# Patient Record
Sex: Female | Born: 1950
Health system: Southern US, Community
[De-identification: ages and names within clinical notes are randomized; demographics above are authoritative.]

## PROBLEM LIST (undated history)

## (undated) DIAGNOSIS — E119 Type 2 diabetes mellitus without complications: Secondary | ICD-10-CM

## (undated) DIAGNOSIS — I10 Essential (primary) hypertension: Secondary | ICD-10-CM

## (undated) DIAGNOSIS — I2510 Atherosclerotic heart disease of native coronary artery without angina pectoris: Secondary | ICD-10-CM

## (undated) DIAGNOSIS — K5792 Diverticulitis of intestine, part unspecified, without perforation or abscess without bleeding: Secondary | ICD-10-CM

## (undated) HISTORY — PX: CORONARY ANGIOPLASTY WITH STENT PLACEMENT: SHX49

---

## 2001-06-24 ENCOUNTER — Inpatient Hospital Stay (HOSPITAL_COMMUNITY): Admission: AD | Admit: 2001-06-24 | Discharge: 2001-06-29 | Payer: Self-pay | Admitting: Cardiology

## 2003-10-05 ENCOUNTER — Emergency Department (HOSPITAL_COMMUNITY): Admission: EM | Admit: 2003-10-05 | Discharge: 2003-10-06 | Payer: Self-pay | Admitting: Emergency Medicine

## 2004-08-08 ENCOUNTER — Emergency Department (HOSPITAL_COMMUNITY): Admission: EM | Admit: 2004-08-08 | Discharge: 2004-08-09 | Payer: Self-pay | Admitting: Emergency Medicine

## 2005-08-25 IMAGING — CR DG CHEST 1V PORT
1 series · 1 of 1 positions shown · non-contrast
Comparison: none

CLINICAL DATA: Arm pain and numbness.  History of heart disease. 
 CHEST PORTABLE, ONE VIEW 10/06/03 AT 6665 HOURS
 The heart and mediastinum are unremarkable.  The lungs are clear.  Vascularity is normal.  
 IMPRESSION
 No active disease.

[view not recorded]
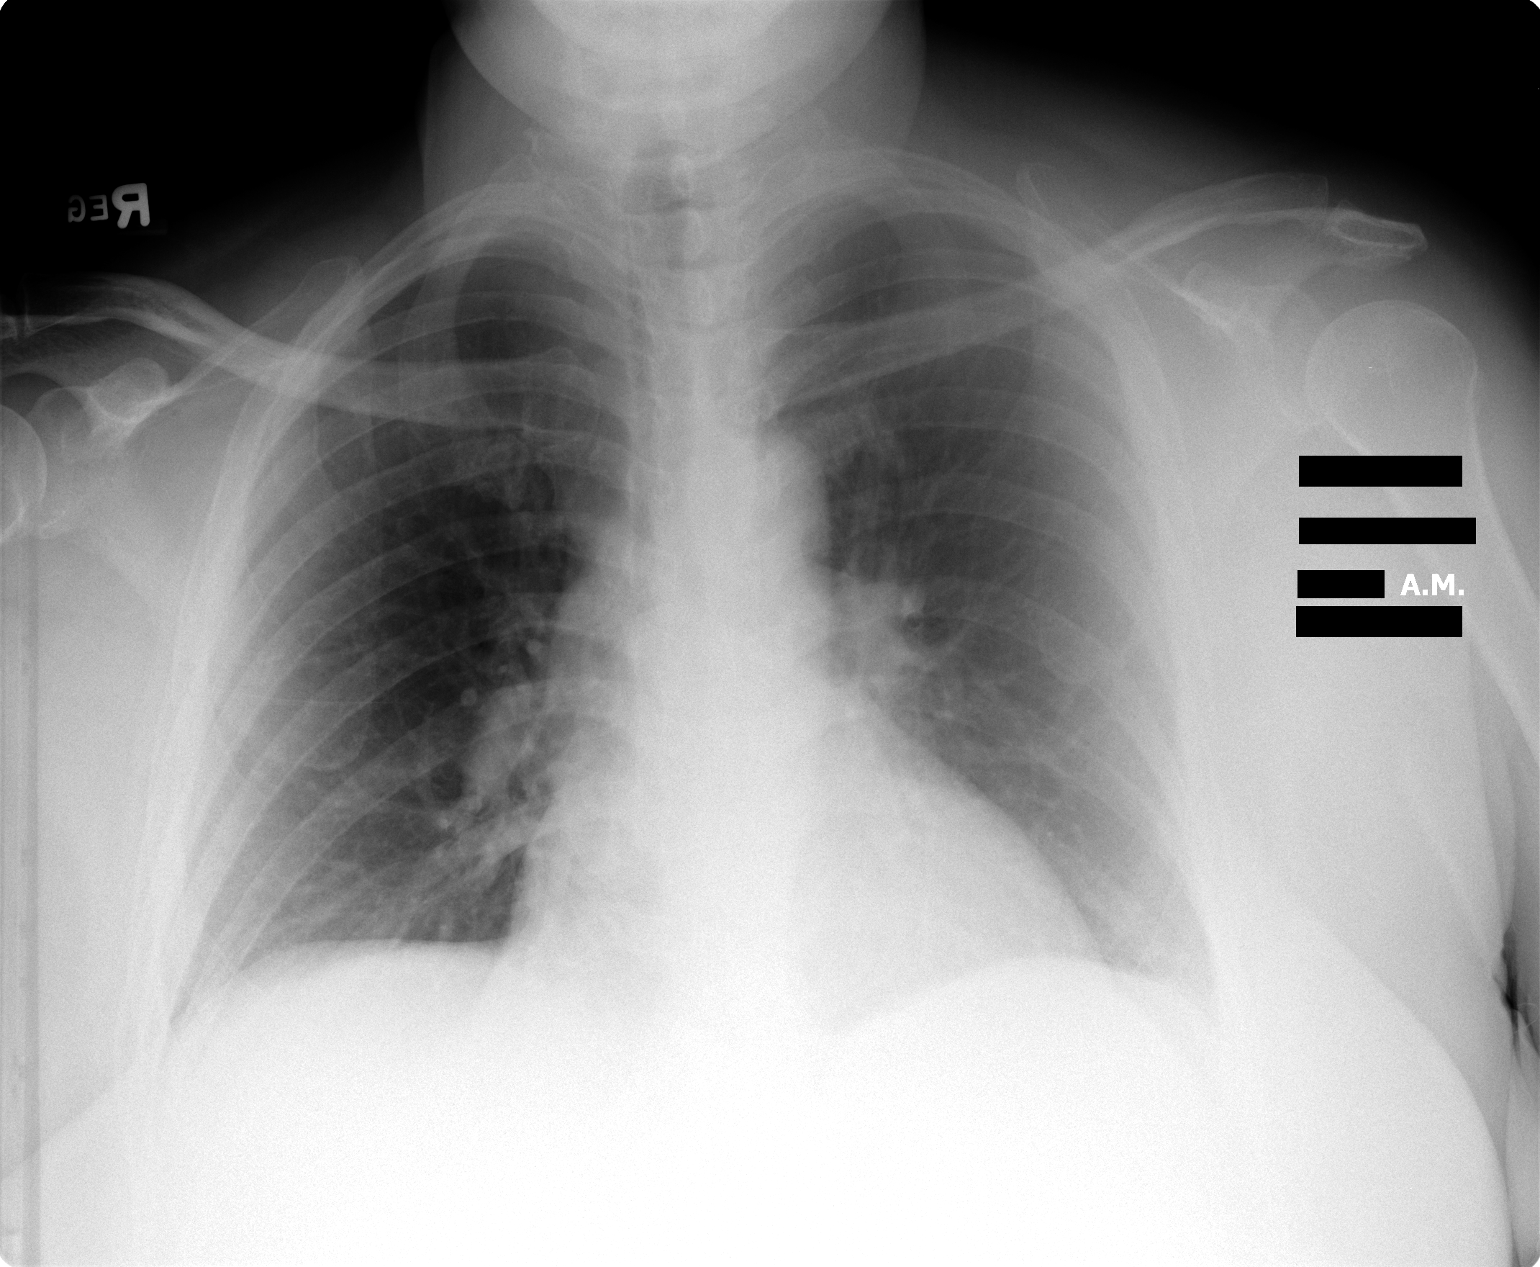

[1 of 1 positions shown; findings below may reference images not displayed]

## 2008-10-05 ENCOUNTER — Inpatient Hospital Stay (HOSPITAL_COMMUNITY): Admission: AD | Admit: 2008-10-05 | Discharge: 2008-10-06 | Payer: Self-pay | Admitting: Cardiology

## 2008-10-05 ENCOUNTER — Ambulatory Visit: Payer: Self-pay | Admitting: Cardiology

## 2008-10-17 DIAGNOSIS — K5730 Diverticulosis of large intestine without perforation or abscess without bleeding: Secondary | ICD-10-CM | POA: Insufficient documentation

## 2008-10-17 DIAGNOSIS — E119 Type 2 diabetes mellitus without complications: Secondary | ICD-10-CM | POA: Insufficient documentation

## 2008-10-17 DIAGNOSIS — E669 Obesity, unspecified: Secondary | ICD-10-CM

## 2008-10-17 DIAGNOSIS — E785 Hyperlipidemia, unspecified: Secondary | ICD-10-CM

## 2008-10-17 DIAGNOSIS — I2510 Atherosclerotic heart disease of native coronary artery without angina pectoris: Secondary | ICD-10-CM | POA: Insufficient documentation

## 2008-10-17 DIAGNOSIS — R079 Chest pain, unspecified: Secondary | ICD-10-CM

## 2008-10-17 DIAGNOSIS — I10 Essential (primary) hypertension: Secondary | ICD-10-CM | POA: Insufficient documentation

## 2008-11-20 ENCOUNTER — Encounter (INDEPENDENT_AMBULATORY_CARE_PROVIDER_SITE_OTHER): Payer: Self-pay | Admitting: *Deleted

## 2008-12-26 ENCOUNTER — Ambulatory Visit: Payer: Self-pay | Admitting: Cardiovascular Disease

## 2008-12-26 DIAGNOSIS — I2510 Atherosclerotic heart disease of native coronary artery without angina pectoris: Secondary | ICD-10-CM

## 2008-12-26 DIAGNOSIS — I10 Essential (primary) hypertension: Secondary | ICD-10-CM

## 2008-12-29 ENCOUNTER — Encounter: Payer: Self-pay | Admitting: Cardiovascular Disease

## 2008-12-29 ENCOUNTER — Telehealth: Payer: Self-pay | Admitting: Cardiovascular Disease

## 2009-01-05 ENCOUNTER — Telehealth (INDEPENDENT_AMBULATORY_CARE_PROVIDER_SITE_OTHER): Payer: Self-pay | Admitting: *Deleted

## 2010-06-16 LAB — GLUCOSE, CAPILLARY
Glucose-Capillary: 165 mg/dL — ABNORMAL HIGH (ref 70–99)
Glucose-Capillary: 177 mg/dL — ABNORMAL HIGH (ref 70–99)
Glucose-Capillary: 203 mg/dL — ABNORMAL HIGH (ref 70–99)

## 2010-06-16 LAB — BASIC METABOLIC PANEL
CO2: 26 mEq/L (ref 19–32)
Calcium: 8.8 mg/dL (ref 8.4–10.5)
Chloride: 103 mEq/L (ref 96–112)
Creatinine, Ser: 0.58 mg/dL (ref 0.4–1.2)
Glucose, Bld: 252 mg/dL — ABNORMAL HIGH (ref 70–99)
Potassium: 3.3 mEq/L — ABNORMAL LOW (ref 3.5–5.1)

## 2010-06-16 LAB — HEMOGLOBIN A1C: Mean Plasma Glucose: 197 mg/dL

## 2010-07-23 NOTE — H&P (Signed)
[name], [name] NO.:  [id number]   MEDICAL RECORD NO.:  [id number]          PATIENT TYPE:  INP   LOCATION:  3710                         FACILITY:  [facility]   PHYSICIAN:  Tina Morton. Riley Kill, MD, FACCDATE OF BIRTH:  1950/05/03   DATE OF ADMISSION:  10/05/2008  DATE OF DISCHARGE:                              HISTORY & PHYSICAL   PRIMARY CARE PHYSICIAN:  Dr. Caffie Damme at the [facility] in  [place], [place].   PRIMARY CARDIOLOGIST:  None.   CHIEF COMPLAINT:  Chest pain.   HISTORY OF PRESENT ILLNESS:  Tina Bennett is a 60 year old female with a  history of coronary artery disease.  Last night, she had some right-  sided neck pain and went to bed.  She woke up at 1:00 a.m. with worsened  right-sided neck pain.  She now describes as a pressure and some chest  pain that was a substernal chest pressure as well.  She also felt that  her heart was racing.  She went to the emergency room at 2:00 a.m. where  she was treated with nitroglycerin, beta-blocker, and her symptoms  improved.  Her enzymes were negative for MI, but she had Lexiscan that  was abnormal by report.  As part of her evaluation.  She had a CT  angiogram of the chest which was negative for PE, and C-spine x-rays  that showed no acute disease.  She had some diarrhea and nausea, so an  abdominal ultrasound was also performed which was negative.  With the  abnormal Lexiscan, she was sent to [facility] for cath.  Currently, she is resting comfortably.   PAST MEDICAL HISTORY:  1. Status post cardiac catheterization in 2003 after an abnormal      stress test.  Left main okay, LAD 80% to 0 with an Express stent,      circumflex and RCA okay, EF 65-70% with 1 to 2+ MR.  2. Diabetes.  3. Hypertension.  4. Dyslipidemia with an elevated LDL.  5. Obesity.  6. History of diverticulitis.  7. Family history of coronary artery disease.   SURGICAL HISTORY:  She is status post cardiac  catheterization and  hysterectomy.   ALLERGIES:  No known drug allergies.   MEDICATIONS PRIOR TO ADMISSION:  1. Metformin 500 mg b.i.d.  2. Amaryl 4 mg b.i.d.  3. Accupril 20 mg a day.  4. Aspirin 81 mg a day.  5. Lopressor 25 mg b.i.d.   SOCIAL HISTORY:  She lives in [place] and her son lives with her.  She  works as a [employer] Services operational officer.  She denies any history of alcohol, tobacco, or  drug abuse.  She does not exercise.  She does not stick tightly to a  diabetic diet and admits to dietary indiscretions, especially with the  bread.   FAMILY HISTORY:  Her mother died at age 75 with a PE but no heart  disease, but her father died at age 58 of Alzheimer's having a history  of coronary artery disease.  No siblings have heart disease.   REVIEW OF SYSTEMS:  She feels anxious at times.  She has some chronic  dyspnea on exertion that has not changed recently.  She has occasional  abdominal pain.  She has chronic arthralgias and musculoskeletal pain.  Full 14-point review of systems is, otherwise, negative.   PHYSICAL EXAMINATION:  VITAL SIGNS:  Temperature is 97.6, blood pressure  164/80, pulse 56, respiratory rate 18, O2 saturation 100% on room air.  GENERAL:  She is a well-developed, obese, African American female in no  acute distress.  HEENT:  normal.  NECK:  There is no lymphadenopathy, thyromegaly, bruit, or JVD noted.  CV:  Heart is regular rate and rhythm with an S1 and S2 and no  significant murmur, rub, or gallop is noted.  Distal pulses are intact  in all 4 extremities.  LUNGS:  Clear to auscultation bilaterally.  SKIN:  No rashes or lesions are noted.  ABDOMEN:  Soft and she has some lower abdominal tenderness but no  guarding.  She has active bowel sounds.  EXTREMITIES:  There is no cyanosis, clubbing, or edema noted.  MUSCULOSKELETAL:  There is no joint deformity or effusions and no spine  or CVA tenderness.  NEURO:  She is alert and oriented.  Cranial nerves II through  XII  grossly intact.   Chest x-ray shows heart size in upper limits of normal with pulmonary  vascular congestion.  CT angiogram of the chest shows no PE and minimal  bibasilar atelectasis but no fluid.   C-spine x-rays, no acute disease.   Abdominal ultrasound, no abnormality.   EKG, sinus rhythm, rate 88 with no acute ischemic changes.   LABORATORY VALUES:  Hemoglobin 12.8, hematocrit 38.3, WBC 5.9, platelets  242.  Sodium 138, potassium 3.8, chloride 103, CO2 of 29, BUN 11,  creatinine 0.72, glucose 269.  D-dimer negative.  INR 0.9, PTT 27.6.  Total cholesterol 169, triglycerides 83, HDL 37.2, LDL 115.  ABGs  showing a pH of 7.43, PCO2 of 43, PO2 of 59, bicarb 28.5 on room air.   IMPRESSION:  Tina Bennett was seen today by Dr. Riley Bennett.  She had diarrhea  and neck discomfort then chest pain.  She had an abnormal nuclear scan  with a history of percutaneous intervention to the left anterior  descending with a non-drug-eluting stent in 2003.  She needs re-study.  The risks and benefits of the cardiac catheterization were reviewed with  the patient and her son and they agreed to proceed.  Of note, she said  her sister had problems with a statin and lost a kidney.  The patient  is reluctant to take a statin but was advised we would use a low-dose  statin  and follow her labs closely.  We will hold the metformin and add sliding  scale insulin for her diabetes as well as checking a hemoglobin A1c.  We  will hydrate her overnight and recheck a BMET in the morning as well as  that she had a CT angiogram today.      Tina Demark, PA-C      Tina Morton. Riley Kill, MD, [facility]  Electronically Signed    RB/MEDQ  D:  10/05/2008  T:  10/06/2008  Job:  [id number]

## 2010-07-23 NOTE — Discharge Summary (Signed)
NAME:  Tina Bennett, RAILSBACK NO.:  [id number]   MEDICAL RECORD NO.:  [id number]          PATIENT TYPE:  INP   LOCATION:  3710                         FACILITY:  [facility]   PHYSICIAN:  Verne Carrow, MDDATE OF BIRTH:  [birth year]   DATE OF ADMISSION:  10/05/2008  DATE OF DISCHARGE:  10/06/2008                               DISCHARGE SUMMARY   PRIMARY CARDIOLOGIST:  Verne Carrow, MD   PRIMARY CARE Alicyn Klann:  Dr. Vira Browns at [facility].   DISCHARGE DIAGNOSIS:  Chest pain.   SECONDARY DIAGNOSES:  1. Coronary artery disease status post previous bare metal stenting of      the left anterior descending in 2003.  2. Hypertension.  3. Hyperlipidemia.  4. Diabetes mellitus.  5. Obesity.  6. History diverticulitis.  7. Status post hysterectomy.   ALLERGIES:  No known drug allergies.   PROCEDURES:  1. Left heart cardiac catheterization revealing patent left anterior      descending stent but otherwise nonobstructive disease and normal      left ventricular function with an ejection fraction of 65%.  2. CT of the chest showing no pulmonary embolism with minimal basilar      atelectasis.   HISTORY OF PRESENT ILLNESS:  A 60 year old African American female with  the above problem list.  On the evening prior to admission, she  experienced right neck discomfort.  She went to bed and awoke around  1:00 a.m. with substernal chest pressure, as well as recurrent neck pain  and pressure and tachy palpitations.  She presented to the [facility] ER where she was treated with beta-blocker and nitroglycerin  with improvement.  Enzymes were negative, and she subsequently underwent  Lexiscan and Myoview, which was reportedly abnormal.  She was sent to  [facility] for further evaluation.   HOSPITAL COURSE:  The patient had no recurrent chest pain.  In the light  of the abnormal Myoview, she was set up for catheterization, which took  place this morning, showing  nonobstructive disease and patent LAD stent.  EF was normal at 65%.  She is being discharged home today in good  condition.   DISCHARGE LABORATORY DATA:  Sodium 135, potassium 3.3 (replaced prior to  discharge), chloride 103, CO2 26, BUN 7, creatinine 0.58, glucose 252,  calcium 8.8.   DISPOSITION:  The patient will be discharged home today in good  condition.   FOLLOWUP PLANS AND APPOINTMENTS:  We have arranged for a followup with  Dr. Clifton James on August 11 at 3:45 p.m.  We could consider transition of  her care to our [place] office.  She is asked to follow up with Dr.  Katrinka Blazing at [facility] as scheduled.   DISCHARGE MEDICATIONS:  1. Nitroglycerin 0.4 mg sublingual p.r.n. chest pain.  2. Simvastatin 40 mg daily.  3. Accupril 20 mg daily.  4. Amaryl 4 mg b.i.d.  5. Aspirin 81 mg daily.  6. Metformin 500 mg b.i.d. to be resumed on October 08, 2008.  7. Metoprolol tartrate 25 mg b.i.d.   OUTSTANDING LABORATORY DATA AND STUDIES:  None.   DURATION OF DISCHARGE ENCOUNTER:  35 minutes including physician time.      Nicolasa Ducking, ANP      Verne Carrow, MD  Electronically Signed    CB/MEDQ  D:  10/06/2008  T:  10/07/2008  Job:  [id number]   cc:   Dr. Vira Browns at [facility].

## 2010-07-26 NOTE — Cardiovascular Report (Signed)
[place]. [facility]  Patient:    Tina Bennett, Tina Bennett Visit Number: [id number] MRN: [id number]          Service Type: MED Location: [phone] Attending Physician:  Nelta Numbers Dictated by:   Jonelle Sidle, M.D. [facility] Proc. Date: 06/24/01 Admit Date:  06/24/2001   CC:         Dr. Garnette Czech, M.D.   Cardiac Catheterization  PRIMARY CARE PHYSICIAN:  Dr. Jillyn Hidden.  CARDIOLOGIST:  Earl Many, M.D.  INDICATION: Ms. Abdulla is a 60 year old woman with a history of type 2 diabetes mellitus, hypertension, dyslipidemia and obesity.  She presents following an episode of chest pain at rest, associated with dynamic ST-T-wave changes in the anterolateral leads.  She apparently underwent a Cardiolite stress test, which suggested ischemia in the inferior distribution, with a normal left ventricular ejection fraction of 67%.  She is now referred for cardiac catheterization to define the coronary anatomy and assess for revascularization options.  PROCEDURES PERFORMED: 1. Left heart catheterization. 2. Selective coronary angiography. 3. Left ventriculography.  ACCESS AND EQUIPMENT:  The area about the right femoral artery was anesthetized with 1% lidocaine.  A 6-French sheath was placed in the right femoral artery, via the modified Seldinger technique.  Standard preformed 6-French JL4 and JR4 catheters were used for selective coronary angiography. A 6-French angled pigtail catheter was used for left heart catheterization and left ventriculography.  All exchanges were made over a wire.  The patient tolerated the procedure well without complication.  A Perclose device was used to close the arteriotomy.  HEMODYNAMICS: 1. Left ventricular pressure:  142/16 mmHg. 2. Aortic pressure:  142/77 mmHg.  ANGIOGRAPHIC FINDINGS: 1. LEFT MAIN CORONARY ARTERY:  Free of significant of flow limiting    coronary atherosclerosis. 2. LEFT ANTERIOR  DESCENDING ARTERY:  A large-caliber vessel that supplies    three small to medium proximal diagonal branches, and three very small    distal branches.  There is an 80% mid vessel stenosis that appears    somewhat hazy.  TIMI-3 flow is observed distal to this. 3. CIRCUMFLEX CORONARY ARTERY:  A medium-caliber vessel that provides    two branching obtuse marginal branches.  There is no significant flow    limiting coronary atherosclerosis noted within this system. 4. RIGHT CORONARY ARTERY:  A large vessel that is dominant and supplies    the posterior descending branch.  No significant flow limiting coronary    atherosclerosis is noted in this system.  LEFT VENTRICULOGRAPHY: Reveals an ejection fraction of 65-70%, with no focal wall motion abnormalities.  There is 1-2+ mitral regurgitation noted in the setting of ectopy.  DIAGNOSES: 1. Single-vessel obstructive coronary artery disease, with an 80% mid LAD    stenosis. 2. Normal left ventricular ejection fraction of 65-70%. 3. There is 1-2+ mitral regurgitation in the setting of ectopy.  PLAN:  Will review films with interventionalist.  The Cardiolite apparently showed inferior ischemia, but presenting electrocardiographic changes were more consistent with an LAD lesion.  Will likely plan percutaneous intervention to the left anterior descending.  Would also follow up with a 2-D echocardiogram to better evaluate mitral valve status.Dictated by:   Jonelle Sidle, M.D. [facility] Attending Physician:  Nelta Numbers DD:  06/24/01 TD:  06/26/01 Job: [id number]

## 2010-07-26 NOTE — Cardiovascular Report (Signed)
[place]. [facility]  Patient:    Tina Bennett, Tina Bennett Visit Number: [id number] MRN: [id number]          Service Type: MED Location: [phone] Attending Physician:  Tina Bennett Dictated by:   Tina Bennett, M.D. [facility] Proc. Date: 06/28/01 Admit Date:  06/24/2001   CC:         Tina Bennett, M.D.  Cardiovascular Laboratory  Dr. Jillyn Bennett   Cardiac Catheterization  INDICATIONS:  Tina Bennett is a delightful 60 year old female who presented with some chest pain.  She had an abnormal Cardiolite with inferior ischemia. However, at catheterization she had a high-grade subtotal stenosis of the mid left anterior descending artery.  We elected to perform percutaneous stenting of this vessel.  The risks, benefits and alternatives were discussed with the patient prior to intervention.  PROCEDURES PERFORMED: 1. Percutaneous stenting of the left anterior descending artery. 2. Intravascular ultrasound of the left anterior descending artery.  DESCRIPTION OF PROCEDURE:  The procedure was performed from the left femoral artery.  She had been previously Perclosed in the right.  A 6-French sheath was placed.  The patient was treated with Angiomax and with the ACT being extended to 310 sec.  She had been pretreated with Plavix.  A JL3.5 guiding catheter was utilized, and with the wire in place did not dampen the left main.  We were able to cross the lesion without difficulty.  We directly stented the lesion using a 12 x 2.75 mm Express II [employer] stent. Following this, we post-dilated using a 3.25 x 8 mm Quantum Maverick balloon. Several inflations were performed for maximum dilatation of the stent.  On the pre studies, and subsequently on the post studies, there was evidence of a slight degree of haziness above the stent site, in only one view.  In other views the vessel appeared to be widely patent.  Because of concern, we performed  intravascular ultrasound distally to proximally in this area.  The ultrasound demonstrated a widely patent stent, and more proximally there were two diagonals coming into place, without evidence of any kind of intraluminal flap noted.  All catheters were subsequently removed. I reviewed the ultrasound with Tina Bennett.  We both felt that based on the ultrasound findings that no further therapy was recommended.  The patient was taken to the holding area in satisfactory clinical condition.  ANGIOGRAPHIC DATA:  Prior to the procedure, the patient had a high-grade stenosis of the left anterior descending artery in the mid vessel, beyond the origin of two diagonal branches.  Following the final dilatation, the high-grade stenosis was reduced to 0%.  Just proximal to the stenosis there were takeoffs of two diagonal branches.  Both had mild luminal irregularities. The LAD itself, just after the two diagonals, had some evidence of intraluminal plaquing -- which probably accounted for the slight irregularity in the other view; however, the artery appeared to be widely patent.  The patient was also noted on the preprocedure to have about a 70% stenosis near the apex, and this did not appear to be changed.  CONCLUSIONS:  Successful percutaneous stenting of the left anterior descending artery.  DISPOSITION:  The patient will be treated with aspirin and Plavix.  Because of her diabetes, her risk of target vessel revascularization is somewhat increased.  We will recommend follow-up with Tina Bennett in cardiology.   DIAGNOSES: 1. Single-vessel obstructive coronary artery disease, with an 80% mid LAD    stenosis. 2.  Normal left ventricular ejection fraction of 65-70%. 3. There is 1-2+ mitral regurgitation in the setting of ectopy.  PLAN:  Will review films with interventionalist.  The Cardiolite apparently showed inferior ischemia, but presenting electrocardiographic changes were more  consistent with an LAD lesion.  Will likely plan percutaneous intervention to the left anterior descending.  Would also follow up with a 2-D echocardiogram to better evaluate mitral valve status.Dictated by:   Tina Bennett, M.D. [facility] Attending Physician:  Tina Bennett DD:  06/28/01 TD:  06/29/01 Job: [id number]

## 2010-07-26 NOTE — Discharge Summary (Signed)
[place]. [facility]  Patient:    Tina, Bennett Visit Number: [id number] MRN: [id number]          Service Type: MED Location: [phone] Attending Physician:  Nelta Numbers Dictated by:   Joellyn Rued, P.A.-C. Admit Date:  06/24/2001 Disc. Date: 06/29/01   CC:         Dr. Garnette Czech, M.D., [place], [place]   Referring Physician Discharge [place]  DATE OF BIRTH:  [birth year]  SUMMARY OF HISTORY:  Tina Bennett is a 60 year old black female, who was admitted to [facility] on June 21, 2001, for chest discomfort that started while straining during a bowel movement.  She ruled out for a myocardial infarction; however, an adenosine Cardiolite showed inferior wall ischemia with an EF of 67%; thus, she was transferred for cath.  She has not had any further chest discomfort since admission.  She has a history of a new diagnosis of diabetes and was started on Amaryl during her [facility] admission. She also has a history of hyperlipidemia and obesity.  LABORATORY DATA:  At [facility], PT was 10.8, PTT 19.6.  TSH .96.  CKs and troponins were negative for myocardial infarction.  Total cholesterol was 180, HDL 35.8, LDL 108, triglycerides 178.  Sodium 138, potassium 4.2, BUN 13, creatinine .7.  Normal LFTs.  Albumin was slightly low at 3.2.  Glucose was elevated at 272.  UA showed 3+ glucose.  H&H was 13.2 and 38.4, normal indices, platelets 247, WBC 4.0.  On transfer, glucose remained slightly elevated.  Chemistries and hematologies remained unchanged.  Chest x-ray did not show any acute abnormality.  EKG showed normal sinus rhythm, nonspecific ST-T wave changes.  An echocardiogram obtained on June 25, 2001, showed an EF of 60%, no wall motion abnormalities, mild LVH, mild MR.  HOSPITAL COURSE:  Ms. Shadowens underwent cardiac catheterization on June 24, 2001, by Dr. Diona Browner.  According to his progress note, she had  an 80% mid LAD.  He noted that the Cardiolite showed inferior ischemia, but her presenting EKG changes were more typical of an LAD lesion.  Her circumflex and RCA and left main did not have any disease.  This was reviewed with interventionalist cardiologist and it felt that she should undergo angioplasty stenting.  Over the weekend, her medications were adjusted.  It was noted that on June 25, 2001, she did have an episode of chest discomfort that resolved spontaneously and lasted less than five minutes.  Case management also saw. On June 28, 2001, Dr. Riley Kill performed angioplasty stenting to the mid LAD reducing a 90% lesion to 0%.  Postsheath removal and bed rest, she was ambulating without difficulty, catheterization site was intact.  Thus, Dr. Riley Kill felt she could be discharged home.  DISCHARGE DIAGNOSES: 1. Unstable angina pectoris. 2. New onset diabetes. 3. Hypolipidemia with low HDL. 4. Obesity.  DISPOSITION:  She is discharged home.  DISCHARGE MEDICATIONS: 1. Plavix 75 mg q.d. x 4 weeks (new). 2. Coated aspirin 325 q.d. (new). 3. Amaryl 2 mg q.d. (new). 4. Toprol XL 25 mg q.d. (new). 5. Nexium 40 mg q.d. (new). 6. Sublingual nitroglycerin as needed (new).  DISCHARGE INSTRUCTIONS 1. She was advised no lifting, driving, sexual activity, heavy exertion or    working for two days. 2. Maintain a low salt, fat, cholesterol, ADA diet. 3. If she has any problems with her catheterization site, she was asked to    call Dr. Sherril Croon immediately. 4.  She will call Dr. Sherril Croon today or tomorrow to arrange a 1-2 week appointment    with him. 5. She will also follow up with Dr. Jillyn Hidden in regards to her blood sugars. Dictated by:   Joellyn Rued, P.A.-C. Attending Physician:  Nelta Numbers DD:  06/29/01 TD:  06/29/01 Job: [id number]

## 2012-03-12 ENCOUNTER — Other Ambulatory Visit: Payer: Self-pay | Admitting: Family Medicine

## 2012-05-07 ENCOUNTER — Institutional Professional Consult (permissible substitution): Payer: Self-pay | Admitting: Cardiovascular Disease

## 2012-06-10 ENCOUNTER — Institutional Professional Consult (permissible substitution): Payer: Self-pay | Admitting: Cardiovascular Disease

## 2015-06-13 DIAGNOSIS — E119 Type 2 diabetes mellitus without complications: Secondary | ICD-10-CM | POA: Insufficient documentation

## 2015-06-13 DIAGNOSIS — J302 Other seasonal allergic rhinitis: Secondary | ICD-10-CM | POA: Insufficient documentation

## 2015-12-06 ENCOUNTER — Encounter: Payer: Self-pay | Admitting: Sports Medicine

## 2015-12-06 ENCOUNTER — Ambulatory Visit (INDEPENDENT_AMBULATORY_CARE_PROVIDER_SITE_OTHER): Payer: Medicare Other | Admitting: Sports Medicine

## 2015-12-06 VITALS — Resp 16 | Ht 68.0 in | Wt 210.0 lb

## 2015-12-06 DIAGNOSIS — L600 Ingrowing nail: Secondary | ICD-10-CM | POA: Diagnosis not present

## 2015-12-06 DIAGNOSIS — E119 Type 2 diabetes mellitus without complications: Secondary | ICD-10-CM | POA: Diagnosis not present

## 2015-12-06 DIAGNOSIS — M79675 Pain in left toe(s): Secondary | ICD-10-CM | POA: Diagnosis not present

## 2015-12-06 DIAGNOSIS — B351 Tinea unguium: Secondary | ICD-10-CM | POA: Diagnosis not present

## 2015-12-06 NOTE — Progress Notes (Signed)
Subjective: Tina Bennett is a 65 y.o. female patient seen today in office with complaint of painful thickened and discolored nails Left hallux greater than right with a little pain on the lateral corner. Patient is desiring treatment for nail changes; has tried OTC topicals/soaking in the past with no improvement. Reports that nails are becoming difficult to manage because of the thickness and noticed a little pain at the left hallux nail corner that she thought she should get checked out since she is diabetic. A1c 6.4 and glucose at home yesterday was 126mg /dl. Denies numbness, tingling, burning to toes. Patient has no other pedal complaints at this time.   Patient Active Problem List   Diagnosis Date Noted  . Seasonal allergic rhinitis 06/13/2015  . Type 2 diabetes mellitus without complication, without long-term current use of insulin (HCC) 06/13/2015  . HYPERTENSION, BENIGN 12/26/2008  . CAD, NATIVE VESSEL 12/26/2008  . DM 10/17/2008  . HYPERLIPIDEMIA 10/17/2008  . OBESITY 10/17/2008  . Essential hypertension 10/17/2008  . CAD 10/17/2008  . DIVERTICULAR DISEASE 10/17/2008  . CHEST PAIN 10/17/2008    Current Outpatient Prescriptions on File Prior to Visit  Medication Sig Dispense Refill  . lisinopril (PRINIVIL,ZESTRIL) 20 MG tablet TAKE ONE TABLET BY MOUTH EVERY DAY - NEEDS APPOINTMENT 30 tablet 0   No current facility-administered medications on file prior to visit.     No Known Allergies  Objective: Physical Exam  General: Well developed, nourished, no acute distress, awake, alert and oriented x 3  Vascular: Dorsalis pedis artery 2/4 bilateral, Posterior tibial artery 2/4 bilateral, skin temperature warm to warm proximal to distal bilateral lower extremities, no varicosities, pedal hair present bilateral.  Neurological: Gross sensation present via light touch bilateral.   Dermatological: Skin is warm, dry, and supple bilateral, Nails 1-10 are tender, short thick, and  discolored with mild subungal debris left>right hallux with mild distal incurvation of lateral nail margin at left hallux with no signs of infection, no webspace macerations present bilateral, no open lesions present bilateral, no callus/corns/hyperkeratotic tissue present bilateral. No signs of infection bilateral.  Musculoskeletal: Toe fracture Right 5th toe that is currently being treated by Dr. Erlinda Hongatoe, Orthopedic doctor.  Mild tenderness to left>right hallux nail. Muscular strength within normal limits. No pain with calf compression bilateral.  Assessment and Plan:  Problem List Items Addressed This Visit    None    Visit Diagnoses    Dermatophytosis of nail    -  Primary   Relevant Orders   Fungus Culture with Smear   Ingrown nail       Toe pain, left       Diabetes mellitus without complication (HCC)       Relevant Medications   Exenatide ER (BYDUREON) 2 MG PEN   Exenatide ER 2 MG SRER   lisinopril-hydrochlorothiazide (PRINZIDE,ZESTORETIC) 20-12.5 MG tablet   metFORMIN (GLUCOPHAGE) 1000 MG tablet   metFORMIN (GLUCOPHAGE) 1000 MG tablet     -Examined patient -Advised daily foot inspection in the setting of diabetes  -Recommend continue with follow up with Ortho doctor for Right foot toe fracture -Discussed treatment options for painful dystrophic nails with ingrowing -Offending margins at left hallux nail trimmed and  F[facility] -Patient to return in 4 weeks for follow up evaluation and discussion of fungal culture results or sooner if symptoms worsen.  Asencion Islamitorya Akita Maxim, DPM

## 2016-01-03 ENCOUNTER — Encounter: Payer: Self-pay | Admitting: Sports Medicine

## 2016-01-03 ENCOUNTER — Ambulatory Visit (INDEPENDENT_AMBULATORY_CARE_PROVIDER_SITE_OTHER): Payer: Medicare Other | Admitting: Sports Medicine

## 2016-01-03 ENCOUNTER — Telehealth: Payer: Self-pay | Admitting: *Deleted

## 2016-01-03 DIAGNOSIS — B351 Tinea unguium: Secondary | ICD-10-CM | POA: Diagnosis not present

## 2016-01-03 DIAGNOSIS — E119 Type 2 diabetes mellitus without complications: Secondary | ICD-10-CM | POA: Diagnosis not present

## 2016-01-03 MED ORDER — NONFORMULARY OR COMPOUNDED ITEM: 11 refills | Status: AC

## 2016-01-03 NOTE — Telephone Encounter (Addendum)
-----   Message from Asencion Islamitorya Stover, [place] sent at 01/03/2016  9:50 AM EDT ----- Regarding: [facility] Topical for onychomycosis -Dr Marylene LandStover. [facility]

## 2016-01-03 NOTE — Progress Notes (Signed)
Subjective: Tina Bennett is a 65 y.o. Diabetic female patient seen today in office for fungal culture results. Patient also has a right foot fracture of which Dr. Charisse Marchatoe/Ortho is taking care of. Patient has no other pedal complaints at this time.   FBS "good"  Patient Active Problem List   Diagnosis Date Noted  . Seasonal allergic rhinitis 06/13/2015  . Type 2 diabetes mellitus without complication, without long-term current use of insulin (HCC) 06/13/2015  . HYPERTENSION, BENIGN 12/26/2008  . CAD, NATIVE VESSEL 12/26/2008  . DM 10/17/2008  . HYPERLIPIDEMIA 10/17/2008  . OBESITY 10/17/2008  . Essential hypertension 10/17/2008  . CAD 10/17/2008  . DIVERTICULAR DISEASE 10/17/2008  . CHEST PAIN 10/17/2008    Current Outpatient Prescriptions on File Prior to Visit  Medication Sig Dispense Refill  . amoxicillin (AMOXIL) 875 MG tablet     . Exenatide ER (BYDUREON) 2 MG PEN INJECT 2 MG INTO THE SKIN ONCE A WEEK    . Exenatide ER 2 MG SRER Inject 2 mg into the skin.    . fluticasone (FLONASE) 50 MCG/ACT nasal spray Place into the nose.    Marland Kitchen. lisinopril (PRINIVIL,ZESTRIL) 20 MG tablet TAKE ONE TABLET BY MOUTH EVERY DAY - NEEDS APPOINTMENT 30 tablet 0  . lisinopril-hydrochlorothiazide (PRINZIDE,ZESTORETIC) 20-12.5 MG tablet     . loratadine (CLARITIN) 10 MG tablet Take 10 mg by mouth.    . metFORMIN (GLUCOPHAGE) 1000 MG tablet Take 1,000 mg by mouth.    . metFORMIN (GLUCOPHAGE) 1000 MG tablet      No current facility-administered medications on file prior to visit.     No Known Allergies  Objective: Physical Exam  General: Well developed, nourished, no acute distress, awake, alert and oriented x 3  Vascular: Dorsalis pedis artery 2/4 bilateral, Posterior tibial artery 2/4 bilateral, skin temperature warm to warm proximal to distal bilateral lower extremities, no varicosities, pedal hair present bilateral.  Neurological: Gross sensation present via light touch bilateral.    Dermatological: Skin is warm, dry, and supple bilateral, Nails 1-10 are tender, short thick, and discolored with mild subungal debris with L>R hallux most involved, no webspace macerations present bilateral, no open lesions present bilateral, no callus/corns/hyperkeratotic tissue present bilateral. No signs of infection bilateral.  Musculoskeletal: Right 5th toe fracture currently being treated by Ortho. Mild tenderness at L>R hallux nail secondary to thickness. Muscular strength within normal limits without painon range of motion. No pain with calf compression bilateral.  Fungal culture + Saprophytic mold  Assessment and Plan:  Problem List Items Addressed This Visit    None    Visit Diagnoses    Dermatophytosis of nail    -  Primary   Relevant Orders   Hepatic Function Panel   Diabetes mellitus without complication (HCC)       Relevant Orders   Hepatic Function Panel      -Examined patient -Discussed treatment options for painful mycotic nails -Patient opt for oral Lamisil with full understanding of medication risks; ordered LFTs for review if within normal limits will proceed with sending Rx to pharmacy for lamisil 250mg  PO daily. Anticipate 12 week course.  -Patient opt for topical as well rx requested for [facility] with instruction to file nails with application  -Advised good hygiene habits -Patient to return in 6 weeks for follow up evaluation or sooner if symptoms worsen. Patient to also let us know if her ortho doctor is continuing care for her right toe fracture. If her Ortho doctor has released her from  his care we will xray her right foot at next visit and determine if her fracture is healed and if she can discontinue post op shoe.  Asencion Islam, DPM

## 2016-01-04 ENCOUNTER — Telehealth: Payer: Self-pay | Admitting: *Deleted

## 2016-01-04 LAB — HEPATIC FUNCTION PANEL
ALBUMIN: 3.9 g/dL (ref 3.6–4.8)
ALK PHOS: 85 IU/L (ref 39–117)
ALT: 10 IU/L (ref 0–32)
AST: 8 IU/L (ref 0–40)
Bilirubin Total: 0.3 mg/dL (ref 0.0–1.2)
Bilirubin, Direct: 0.08 mg/dL (ref 0.00–0.40)
Total Protein: 7.4 g/dL (ref 6.0–8.5)

## 2016-01-04 MED ORDER — TERBINAFINE HCL 250 MG PO TABS: 250.0000 mg | ORAL_TABLET | Freq: Every day | ORAL | 0 refills | Status: AC

## 2016-01-04 NOTE — Telephone Encounter (Addendum)
-----   Message from Asencion Islamitorya Stover, [place] sent at 01/04/2016  7:12 AM EDT ----- Can you let patient know that her liver function was normal and send to her pharmacy Lamisil 250mg  PO daily disp 90. Thanks, Dr. Marylene LandStover. Informed pt of the orders.

## 2016-01-08 ENCOUNTER — Telehealth: Payer: Self-pay | Admitting: *Deleted

## 2016-01-08 NOTE — Telephone Encounter (Signed)
Pt states [facility] called asking about her insurance information and she thought if the medication was to come from Dr.Stover then our office would have given them the imformation and she was just going to give the information out to anyone. I informed pt that [facility] would call to collect and give information concerning her insurance coverage and delivery of the medication. I told pt I would give her the [facility] contact number, so she would feel more comfortable. Pt states she had their phone number come up on her screen and she will call again.

## 2016-02-14 ENCOUNTER — Ambulatory Visit: Payer: Medicare Other | Admitting: Sports Medicine

## 2016-02-22 ENCOUNTER — Ambulatory Visit (INDEPENDENT_AMBULATORY_CARE_PROVIDER_SITE_OTHER): Payer: Medicare Other | Admitting: Sports Medicine

## 2016-02-22 ENCOUNTER — Encounter: Payer: Self-pay | Admitting: Sports Medicine

## 2016-02-22 DIAGNOSIS — Z79899 Other long term (current) drug therapy: Secondary | ICD-10-CM

## 2016-02-22 DIAGNOSIS — E119 Type 2 diabetes mellitus without complications: Secondary | ICD-10-CM | POA: Diagnosis not present

## 2016-02-22 DIAGNOSIS — B351 Tinea unguium: Secondary | ICD-10-CM

## 2016-02-22 NOTE — Progress Notes (Signed)
Subjective: Tina Bennett is a 65 y.o. Diabetic female patient seen today in office for medication check. Patient reports that she has gotten things mixed up and has not started on the Lamisil medicine as prescribed, because she has had death in the family and has a lot going on and hasn't had the opportunity to start the medication for her nail fungus. Patient also has a right foot fracture of which Dr. Charisse Marchatoe/Ortho is taking care of, however patient is very confused and states that she thinks she may have lost her follow-up paperwork for the orthopedic doctor and is unsure of her next visit to make sure her right foot fracture is healed. Patient states that she has been wearing normal shoes with no pain and symptoms. Patient has no other pedal complaints at this time.   FBS "good"  Patient Active Problem List   Diagnosis Date Noted  . Seasonal allergic rhinitis 06/13/2015  . Type 2 diabetes mellitus without complication, without long-term current use of insulin (HCC) 06/13/2015  . HYPERTENSION, BENIGN 12/26/2008  . CAD, NATIVE VESSEL 12/26/2008  . DM 10/17/2008  . HYPERLIPIDEMIA 10/17/2008  . OBESITY 10/17/2008  . Essential hypertension 10/17/2008  . CAD 10/17/2008  . DIVERTICULAR DISEASE 10/17/2008  . CHEST PAIN 10/17/2008    Current Outpatient Prescriptions on File Prior to Visit  Medication Sig Dispense Refill  . amoxicillin (AMOXIL) 875 MG tablet     . Exenatide ER (BYDUREON) 2 MG PEN INJECT 2 MG INTO THE SKIN ONCE A WEEK    . Exenatide ER 2 MG SRER Inject 2 mg into the skin.    . fluticasone (FLONASE) 50 MCG/ACT nasal spray Place into the nose.    Marland Kitchen. lisinopril (PRINIVIL,ZESTRIL) 20 MG tablet TAKE ONE TABLET BY MOUTH EVERY DAY - NEEDS APPOINTMENT 30 tablet 0  . lisinopril-hydrochlorothiazide (PRINZIDE,ZESTORETIC) 20-12.5 MG tablet     . loratadine (CLARITIN) 10 MG tablet Take 10 mg by mouth.    . metFORMIN (GLUCOPHAGE) 1000 MG tablet Take 1,000 mg by mouth.    . metFORMIN  (GLUCOPHAGE) 1000 MG tablet     . NONFORMULARY OR COMPOUNDED ITEM [facility]:  Onychomycosis Nail Lacquer - Fluconazole 2%, Terbinafine 1%, apply daily to affected area. 480 each 11  . terbinafine (LAMISIL) 250 MG tablet Take 1 tablet (250 mg total) by mouth daily. 90 tablet 0   No current facility-administered medications on file prior to visit.     No Known Allergies  Objective: Physical Exam  General: Well developed, nourished, no acute distress, awake, alert and oriented x 3  Vascular: Dorsalis pedis artery 2/4 bilateral, Posterior tibial artery 2/4 bilateral, skin temperature warm to warm proximal to distal bilateral lower extremities, no varicosities, pedal hair present bilateral.  Neurological: Gross sensation present via light touch bilateral.   Dermatological: Skin is warm, dry, and supple bilateral, Nails 1-10 are tender, short thick, and discolored with mild subungal debris with L>R hallux most involved, no webspace macerations present bilateral, no open lesions present bilateral, no callus/corns/hyperkeratotic tissue present bilateral. No signs of infection bilateral.  Musculoskeletal: Right 5th toe fracture currently being treated by Ortho with no acute symptomatology. Mild tenderness at L>R hallux nail secondary to thickness. Muscular strength within normal limits without painon range of motion. No pain with calf compression bilateral.  Fungal culture + Saprophytic mold  Assessment and Plan:  Problem List Items Addressed This Visit    None    Visit Diagnoses    Encounter for long-term current use of high  risk medication    -  Primary   Dermatophytosis of nail       Diabetes mellitus without complication (HCC)         -Examined patient -Discussed treatment options for painful mycotic nails -Patient states that she will think about starting Lamisil and will let our office know what she does so that way she can follow up with me in 6 weeks for now. Patient wants  to try topical treatment and soaks. -Advised good hygiene habits -Patient to return for recheck what she has started treatment. Advised patient to also follow up with ortho doctor for final care/xray of right foot.   Asencion Islamitorya Hareem Surowiec, DPM

## 2016-04-02 ENCOUNTER — Encounter (HOSPITAL_COMMUNITY): Payer: Self-pay | Admitting: Emergency Medicine

## 2016-04-02 ENCOUNTER — Emergency Department (HOSPITAL_COMMUNITY)
Admission: EM | Admit: 2016-04-02 | Discharge: 2016-04-02 | Disposition: A | Discharge status: Home / Self Care | Payer: Medicare Other | Attending: Emergency Medicine | Admitting: Emergency Medicine

## 2016-04-02 ENCOUNTER — Emergency Department (HOSPITAL_COMMUNITY): Payer: Medicare Other

## 2016-04-02 DIAGNOSIS — J988 Other specified respiratory disorders: Secondary | ICD-10-CM | POA: Diagnosis not present

## 2016-04-02 DIAGNOSIS — R0602 Shortness of breath: Secondary | ICD-10-CM

## 2016-04-02 DIAGNOSIS — Z79899 Other long term (current) drug therapy: Secondary | ICD-10-CM | POA: Diagnosis not present

## 2016-04-02 DIAGNOSIS — Z7984 Long term (current) use of oral hypoglycemic drugs: Secondary | ICD-10-CM | POA: Insufficient documentation

## 2016-04-02 DIAGNOSIS — I2510 Atherosclerotic heart disease of native coronary artery without angina pectoris: Secondary | ICD-10-CM | POA: Diagnosis not present

## 2016-04-02 DIAGNOSIS — Z955 Presence of coronary angioplasty implant and graft: Secondary | ICD-10-CM | POA: Diagnosis not present

## 2016-04-02 DIAGNOSIS — I10 Essential (primary) hypertension: Secondary | ICD-10-CM | POA: Insufficient documentation

## 2016-04-02 DIAGNOSIS — E119 Type 2 diabetes mellitus without complications: Secondary | ICD-10-CM | POA: Diagnosis not present

## 2016-04-02 HISTORY — DX: Essential (primary) hypertension: I10

## 2016-04-02 HISTORY — DX: Type 2 diabetes mellitus without complications: E11.9

## 2016-04-02 HISTORY — DX: Atherosclerotic heart disease of native coronary artery without angina pectoris: I25.10

## 2016-04-02 HISTORY — DX: Diverticulitis of intestine, part unspecified, without perforation or abscess without bleeding: K57.92

## 2016-04-02 LAB — BASIC METABOLIC PANEL
Anion gap: 11 (ref 5–15)
BUN: 18 mg/dL (ref 6–20)
CALCIUM: 9.4 mg/dL (ref 8.9–10.3)
CO2: 25 mmol/L (ref 22–32)
CREATININE: 1.04 mg/dL — AB (ref 0.44–1.00)
Chloride: 101 mmol/L (ref 101–111)
GFR, EST NON AFRICAN AMERICAN: 55 mL/min — AB (ref >60)
GLUCOSE: 115 mg/dL — AB (ref 65–99)
Potassium: 3.9 mmol/L (ref 3.5–5.1)
Sodium: 137 mmol/L (ref 135–145)

## 2016-04-02 LAB — CBC WITH DIFFERENTIAL/PLATELET
BASOS PCT: 0 %
Basophils Absolute: 0 10*3/uL (ref 0.0–0.1)
EOS PCT: 2 %
Eosinophils Absolute: 0.1 10*3/uL (ref 0.0–0.7)
HEMATOCRIT: 35.8 % — AB (ref 36.0–46.0)
Hemoglobin: 12.1 g/dL (ref 12.0–15.0)
Lymphocytes Relative: 41 %
Lymphs Abs: 1.5 10*3/uL (ref 0.7–4.0)
MCH: 28.6 pg (ref 26.0–34.0)
MCHC: 33.8 g/dL (ref 30.0–36.0)
MCV: 84.6 fL (ref 78.0–100.0)
MONO ABS: 0.3 10*3/uL (ref 0.1–1.0)
MONOS PCT: 8 %
NEUTROS ABS: 1.8 10*3/uL (ref 1.7–7.7)
Neutrophils Relative %: 49 %
PLATELETS: 234 10*3/uL (ref 150–400)
RBC: 4.23 MIL/uL (ref 3.87–5.11)
RDW: 12.4 % (ref 11.5–15.5)
WBC: 3.7 10*3/uL — ABNORMAL LOW (ref 4.0–10.5)

## 2016-04-02 LAB — I-STAT TROPONIN, ED: Troponin i, poc: 0 ng/mL (ref 0.00–0.08)

## 2016-04-02 MED ORDER — IPRATROPIUM-ALBUTEROL 0.5-2.5 (3) MG/3ML IN SOLN
3.0000 mL | Freq: Once | RESPIRATORY_TRACT | Status: AC
  Administered 2016-04-02: 3 mL via RESPIRATORY_TRACT
  Filled 2016-04-02: qty 3

## 2016-04-02 MED ORDER — ALBUTEROL SULFATE (2.5 MG/3ML) 0.083% IN NEBU
5.0000 mg | INHALATION_SOLUTION | Freq: Once | RESPIRATORY_TRACT | Status: AC
  Administered 2016-04-02: 5 mg via RESPIRATORY_TRACT

## 2016-04-02 MED ORDER — ALBUTEROL SULFATE HFA 108 (90 BASE) MCG/ACT IN AERS: 2.0000 | INHALATION_SPRAY | RESPIRATORY_TRACT | 2 refills | Status: AC | PRN

## 2016-04-02 MED ORDER — ALBUTEROL SULFATE (2.5 MG/3ML) 0.083% IN NEBU
INHALATION_SOLUTION | RESPIRATORY_TRACT | Status: AC
  Filled 2016-04-02: qty 6

## 2016-04-02 MED ORDER — DEXAMETHASONE 4 MG PO TABS
10.0000 mg | ORAL_TABLET | Freq: Once | ORAL | Status: AC
  Administered 2016-04-02: 10 mg via ORAL
  Filled 2016-04-02: qty 3

## 2016-04-02 NOTE — Discharge Instructions (Signed)
Return if your symptoms are getting worse. 

## 2016-04-02 NOTE — ED Provider Notes (Signed)
[facility]-EMERGENCY DEPT Provider Note   CSN: [id number] Arrival date & time: 04/02/16  0045     History   Chief Complaint Chief Complaint  Patient presents with  . Shortness of Breath    HPI Tina Bennett is a 66 y.o. female.  She has been sick for about 1 week with a cough productive of clear sputum. There is associated nasal congestion and bodyaches. She's had low-grade fevers. And her grandson has been sick, but he cut sick after she did. There is no nausea, vomiting, diarrhea. Last night, she felt like she was having trouble catching her breath, especially if she laid flat. She came to the ED where she was given a nebulizer treatment which gave her partial relief. She did receive the influenza immunization this season, and also has received the ammonia vaccination.   The history is provided by the patient.  Shortness of Breath     Past Medical History:  Diagnosis Date  . Coronary artery disease   . Diabetes mellitus without complication (HCC)   . Diverticulitis   . Hypertension     Patient Active Problem List   Diagnosis Date Noted  . Seasonal allergic rhinitis 06/13/2015  . Type 2 diabetes mellitus without complication, without long-term current use of insulin (HCC) 06/13/2015  . HYPERTENSION, BENIGN 12/26/2008  . CAD, NATIVE VESSEL 12/26/2008  . DM 10/17/2008  . HYPERLIPIDEMIA 10/17/2008  . OBESITY 10/17/2008  . Essential hypertension 10/17/2008  . CAD 10/17/2008  . DIVERTICULAR DISEASE 10/17/2008  . CHEST PAIN 10/17/2008    Past Surgical History:  Procedure Laterality Date  . CORONARY ANGIOPLASTY WITH STENT PLACEMENT      OB History    No data available       Home Medications    Prior to Admission medications   Medication Sig Start Date End Date Taking? Authorizing Provider  albuterol (PROVENTIL HFA;VENTOLIN HFA) 108 (90 Base) MCG/ACT inhaler Inhale 2 puffs into the lungs every 4 (four) hours as needed for wheezing or shortness of breath (or  coughing). 04/02/16   Dione Booze, MD  amoxicillin (AMOXIL) 875 MG tablet  10/16/15   Historical Provider, MD  Exenatide ER (BYDUREON) 2 MG PEN INJECT 2 MG INTO THE SKIN ONCE A WEEK 10/15/15   Historical Provider, MD  Exenatide ER 2 MG SRER Inject 2 mg into the skin. 05/18/15   Historical Provider, MD  fluticasone (FLONASE) 50 MCG/ACT nasal spray Place into the nose. 05/04/15   Historical Provider, MD  lisinopril (PRINIVIL,ZESTRIL) 20 MG tablet TAKE ONE TABLET BY MOUTH EVERY DAY - NEEDS APPOINTMENT 03/12/12   Carney Living, MD  lisinopril-hydrochlorothiazide (PRINZIDE,ZESTORETIC) 20-12.5 MG tablet  10/03/15   Historical Provider, MD  loratadine (CLARITIN) 10 MG tablet Take 10 mg by mouth. 10/16/15   Historical Provider, MD  metFORMIN (GLUCOPHAGE) 1000 MG tablet Take 1,000 mg by mouth.    Historical Provider, MD  metFORMIN (GLUCOPHAGE) 1000 MG tablet  08/29/15   Historical Provider, MD  NONFORMULARY OR COMPOUNDED ITEM [facility]:  Onychomycosis Nail Lacquer - Fluconazole 2%, Terbinafine 1%, apply daily to affected area. 01/03/16   Titorya Stover, DPM  terbinafine (LAMISIL) 250 MG tablet Take 1 tablet (250 mg total) by mouth daily. 01/04/16   Asencion Islam, DPM    Family History History reviewed. No pertinent family history.  Social History Social History  Substance Use Topics  . Smoking status: Never Smoker  . Smokeless tobacco: Never Used  . Alcohol use No     Allergies  Patient has no known allergies.   Review of Systems Review of Systems  Respiratory: Positive for shortness of breath.   All other systems reviewed and are negative.    Physical Exam Updated Vital Signs BP 131/80   Pulse 73   Temp 97.7 F (36.5 C) (Oral)   Resp 19   Ht 5\' 8"  (1.727 m)   Wt 201 lb (91.2 kg)   LMP  (LMP Unknown)   SpO2 97%   BMI 30.56 kg/m   Physical Exam  Nursing note and vitals reviewed.  66 year old female, resting comfortably and in no acute distress. Vital signs are normal.  Oxygen saturation is 97%, which is normal. Head is normocephalic and atraumatic. PERRLA, EOMI. Oropharynx is clear. Neck is nontender and supple without adenopathy or JVD. Back is nontender and there is no CVA tenderness. Lungs are clear without rales, wheezes, or rhonchi. Chest is nontender. Heart has regular rate and rhythm without murmur. Abdomen is soft, flat, nontender without masses or hepatosplenomegaly and peristalsis is normoactive. Extremities have no cyanosis or edema, full range of motion is present. Skin is warm and dry without rash. Neurologic: Mental status is normal, cranial nerves are intact, there are no motor or sensory deficits.  ED Treatments / Results  Labs (all labs ordered are listed, but only abnormal results are displayed) Labs Reviewed  CBC WITH DIFFERENTIAL/PLATELET - Abnormal; Notable for the following:       Result Value   WBC 3.7 (*)    HCT 35.8 (*)    All other components within normal limits  BASIC METABOLIC PANEL - Abnormal; Notable for the following:    Glucose, Bld 115 (*)    Creatinine, Ser 1.04 (*)    GFR calc non Af Amer 55 (*)    All other components within normal limits  I-STAT TROPOININ, ED    EKG  EKG Interpretation  Date/Time:  Wednesday April 02 2016 00:50:41 EST Ventricular Rate:  81 PR Interval:  178 QRS Duration: 80 QT Interval:  358 QTC Calculation: 415 R Axis:   3 Text Interpretation:  Normal sinus rhythm Normal ECG When compared with ECG of 10/06/2003, No significant change was found Confirmed by [facility]  MD, Aydn Ferrara ([id number]) on 04/02/2016 12:55:47 AM       Radiology Dg Chest 2 View  Result Date: 04/02/2016 CLINICAL DATA:  66 year old female with shortness of breath. EXAM: CHEST  2 VIEW COMPARISON:  Chest radiograph dated 12/03/2014 FINDINGS: Minimal left lung base atelectasis/ scarring. The lungs are otherwise clear. There is no pleural effusion or pneumothorax. The cardiac silhouette is within normal limits. No acute  osseous pathology. IMPRESSION: No active cardiopulmonary disease. Electronically Signed   By: Elgie CollardArash  Radparvar M.D.   On: 04/02/2016 01:37    Procedures Procedures (including critical care time)  Medications Ordered in ED Medications  albuterol (PROVENTIL) (2.5 MG/3ML) 0.083% nebulizer solution (not administered)  albuterol (PROVENTIL) (2.5 MG/3ML) 0.083% nebulizer solution 5 mg (5 mg Nebulization Given 04/02/16 0104)  ipratropium-albuterol (DUONEB) 0.5-2.5 (3) MG/3ML nebulizer solution 3 mL (3 mLs Nebulization Given 04/02/16 0640)  dexamethasone (DECADRON) tablet 10 mg (10 mg Oral Given 04/02/16 0640)     Initial Impression / Assessment and Plan / ED Course  I have reviewed the triage vital signs and the nursing notes.  Pertinent labs & imaging results that were available during my care of the patient were reviewed by me and considered in my medical decision making (see chart for details).  Respiratory tract infection.  Chest x-ray shows no evidence of pneumonia. Leukopenia is present suggestive of a viral illness. Clinically, she has influenza. She apparently had some bronchospasm since he had relief with albuterol with ipratropium. She states she starting to feel more dyspneic again. She is given a second albuterol with ipratropium nebulizer treatment. I discussed course of steroids with her but she is concerned about her blood sugars and she is diabetic. She's given a dose of dexamethasone in the ED and is discharged with prescription for albuterol inhaler. Return precautions discussed.  Final Clinical Impressions(s) / ED Diagnoses   Final diagnoses:  Respiratory tract infection  SOB (shortness of breath)    New Prescriptions New Prescriptions   ALBUTEROL (PROVENTIL HFA;VENTOLIN HFA) 108 (90 BASE) MCG/ACT INHALER    Inhale 2 puffs into the lungs every 4 (four) hours as needed for wheezing or shortness of breath (or coughing).     Dione Booze, MD 04/02/16 [phone]

## 2016-04-02 NOTE — ED Triage Notes (Signed)
Pt c/o constant productive coughing with white secretions, and increase SOB since last Tuesday, states she is been close to her grandson that had flue last week, denies any cp, fever or chills.

## 2018-08-04 DIAGNOSIS — E785 Hyperlipidemia, unspecified: Secondary | ICD-10-CM | POA: Diagnosis not present

## 2018-08-04 DIAGNOSIS — I10 Essential (primary) hypertension: Secondary | ICD-10-CM | POA: Diagnosis not present

## 2018-08-04 DIAGNOSIS — E119 Type 2 diabetes mellitus without complications: Secondary | ICD-10-CM | POA: Diagnosis not present

## 2018-09-03 DIAGNOSIS — I10 Essential (primary) hypertension: Secondary | ICD-10-CM | POA: Diagnosis not present

## 2018-09-03 DIAGNOSIS — E119 Type 2 diabetes mellitus without complications: Secondary | ICD-10-CM | POA: Diagnosis not present

## 2018-09-03 DIAGNOSIS — E785 Hyperlipidemia, unspecified: Secondary | ICD-10-CM | POA: Diagnosis not present

## 2018-09-28 DIAGNOSIS — Z20828 Contact with and (suspected) exposure to other viral communicable diseases: Secondary | ICD-10-CM | POA: Diagnosis not present

## 2018-12-01 DIAGNOSIS — S99922A Unspecified injury of left foot, initial encounter: Secondary | ICD-10-CM | POA: Diagnosis not present

## 2018-12-01 DIAGNOSIS — M79672 Pain in left foot: Secondary | ICD-10-CM | POA: Diagnosis not present

## 2018-12-01 DIAGNOSIS — S9032XA Contusion of left foot, initial encounter: Secondary | ICD-10-CM | POA: Diagnosis not present

## 2018-12-03 DIAGNOSIS — M79672 Pain in left foot: Secondary | ICD-10-CM | POA: Diagnosis not present

## 2018-12-03 DIAGNOSIS — S99922A Unspecified injury of left foot, initial encounter: Secondary | ICD-10-CM | POA: Diagnosis not present

## 2018-12-06 DIAGNOSIS — I10 Essential (primary) hypertension: Secondary | ICD-10-CM | POA: Diagnosis not present

## 2018-12-06 DIAGNOSIS — E119 Type 2 diabetes mellitus without complications: Secondary | ICD-10-CM | POA: Diagnosis not present

## 2018-12-06 DIAGNOSIS — E785 Hyperlipidemia, unspecified: Secondary | ICD-10-CM | POA: Diagnosis not present

## 2018-12-08 DIAGNOSIS — Z7984 Long term (current) use of oral hypoglycemic drugs: Secondary | ICD-10-CM | POA: Diagnosis not present

## 2018-12-08 DIAGNOSIS — E119 Type 2 diabetes mellitus without complications: Secondary | ICD-10-CM | POA: Diagnosis not present

## 2018-12-08 DIAGNOSIS — E785 Hyperlipidemia, unspecified: Secondary | ICD-10-CM | POA: Diagnosis not present

## 2018-12-16 DIAGNOSIS — S90122A Contusion of left lesser toe(s) without damage to nail, initial encounter: Secondary | ICD-10-CM | POA: Diagnosis not present

## 2018-12-16 DIAGNOSIS — E119 Type 2 diabetes mellitus without complications: Secondary | ICD-10-CM | POA: Diagnosis not present

## 2018-12-16 DIAGNOSIS — R0989 Other specified symptoms and signs involving the circulatory and respiratory systems: Secondary | ICD-10-CM | POA: Diagnosis not present

## 2018-12-21 DIAGNOSIS — R0989 Other specified symptoms and signs involving the circulatory and respiratory systems: Secondary | ICD-10-CM | POA: Diagnosis not present

## 2018-12-21 DIAGNOSIS — I70203 Unspecified atherosclerosis of native arteries of extremities, bilateral legs: Secondary | ICD-10-CM | POA: Diagnosis not present

## 2018-12-28 DIAGNOSIS — I739 Peripheral vascular disease, unspecified: Secondary | ICD-10-CM | POA: Diagnosis not present

## 2018-12-28 DIAGNOSIS — M79672 Pain in left foot: Secondary | ICD-10-CM | POA: Diagnosis not present

## 2018-12-28 DIAGNOSIS — R0989 Other specified symptoms and signs involving the circulatory and respiratory systems: Secondary | ICD-10-CM | POA: Diagnosis not present

## 2018-12-29 DIAGNOSIS — I739 Peripheral vascular disease, unspecified: Secondary | ICD-10-CM | POA: Diagnosis not present

## 2018-12-29 DIAGNOSIS — Z20828 Contact with and (suspected) exposure to other viral communicable diseases: Secondary | ICD-10-CM | POA: Diagnosis not present

## 2018-12-29 DIAGNOSIS — R0989 Other specified symptoms and signs involving the circulatory and respiratory systems: Secondary | ICD-10-CM | POA: Diagnosis not present

## 2018-12-29 DIAGNOSIS — M79672 Pain in left foot: Secondary | ICD-10-CM | POA: Diagnosis not present

## 2018-12-29 DIAGNOSIS — Z01812 Encounter for preprocedural laboratory examination: Secondary | ICD-10-CM | POA: Diagnosis not present

## 2018-12-30 DIAGNOSIS — Z7902 Long term (current) use of antithrombotics/antiplatelets: Secondary | ICD-10-CM | POA: Diagnosis not present

## 2018-12-30 DIAGNOSIS — Z7984 Long term (current) use of oral hypoglycemic drugs: Secondary | ICD-10-CM | POA: Diagnosis not present

## 2018-12-30 DIAGNOSIS — E669 Obesity, unspecified: Secondary | ICD-10-CM | POA: Diagnosis not present

## 2018-12-30 DIAGNOSIS — I739 Peripheral vascular disease, unspecified: Secondary | ICD-10-CM | POA: Diagnosis not present

## 2018-12-30 DIAGNOSIS — I70222 Atherosclerosis of native arteries of extremities with rest pain, left leg: Secondary | ICD-10-CM | POA: Diagnosis not present

## 2018-12-30 DIAGNOSIS — I2510 Atherosclerotic heart disease of native coronary artery without angina pectoris: Secondary | ICD-10-CM | POA: Diagnosis not present

## 2018-12-30 DIAGNOSIS — I10 Essential (primary) hypertension: Secondary | ICD-10-CM | POA: Diagnosis not present

## 2018-12-30 DIAGNOSIS — E785 Hyperlipidemia, unspecified: Secondary | ICD-10-CM | POA: Diagnosis not present

## 2018-12-30 DIAGNOSIS — I998 Other disorder of circulatory system: Secondary | ICD-10-CM | POA: Diagnosis not present

## 2018-12-30 DIAGNOSIS — Z7982 Long term (current) use of aspirin: Secondary | ICD-10-CM | POA: Diagnosis not present

## 2018-12-30 DIAGNOSIS — Z90710 Acquired absence of both cervix and uterus: Secondary | ICD-10-CM | POA: Diagnosis not present

## 2018-12-30 DIAGNOSIS — I743 Embolism and thrombosis of arteries of the lower extremities: Secondary | ICD-10-CM | POA: Diagnosis not present

## 2018-12-30 DIAGNOSIS — M79672 Pain in left foot: Secondary | ICD-10-CM | POA: Diagnosis not present

## 2018-12-30 DIAGNOSIS — Z20828 Contact with and (suspected) exposure to other viral communicable diseases: Secondary | ICD-10-CM | POA: Diagnosis not present

## 2018-12-30 DIAGNOSIS — E119 Type 2 diabetes mellitus without complications: Secondary | ICD-10-CM | POA: Diagnosis not present

## 2018-12-30 DIAGNOSIS — E1151 Type 2 diabetes mellitus with diabetic peripheral angiopathy without gangrene: Secondary | ICD-10-CM | POA: Diagnosis not present

## 2019-01-05 DIAGNOSIS — Z833 Family history of diabetes mellitus: Secondary | ICD-10-CM | POA: Diagnosis not present

## 2019-01-05 DIAGNOSIS — Z79899 Other long term (current) drug therapy: Secondary | ICD-10-CM | POA: Diagnosis not present

## 2019-01-05 DIAGNOSIS — I998 Other disorder of circulatory system: Secondary | ICD-10-CM | POA: Diagnosis not present

## 2019-01-05 DIAGNOSIS — I96 Gangrene, not elsewhere classified: Secondary | ICD-10-CM | POA: Diagnosis not present

## 2019-01-05 DIAGNOSIS — E1152 Type 2 diabetes mellitus with diabetic peripheral angiopathy with gangrene: Secondary | ICD-10-CM | POA: Diagnosis not present

## 2019-01-05 DIAGNOSIS — S98912A Complete traumatic amputation of left foot, level unspecified, initial encounter: Secondary | ICD-10-CM | POA: Diagnosis not present

## 2019-01-05 DIAGNOSIS — Z959 Presence of cardiac and vascular implant and graft, unspecified: Secondary | ICD-10-CM | POA: Diagnosis not present

## 2019-01-05 DIAGNOSIS — Z6833 Body mass index (BMI) 33.0-33.9, adult: Secondary | ICD-10-CM | POA: Diagnosis not present

## 2019-01-05 DIAGNOSIS — I779 Disorder of arteries and arterioles, unspecified: Secondary | ICD-10-CM | POA: Diagnosis not present

## 2019-01-05 DIAGNOSIS — Z89432 Acquired absence of left foot: Secondary | ICD-10-CM | POA: Diagnosis not present

## 2019-01-05 DIAGNOSIS — I739 Peripheral vascular disease, unspecified: Secondary | ICD-10-CM | POA: Diagnosis not present

## 2019-01-05 DIAGNOSIS — I82402 Acute embolism and thrombosis of unspecified deep veins of left lower extremity: Secondary | ICD-10-CM | POA: Diagnosis not present

## 2019-01-05 DIAGNOSIS — R509 Fever, unspecified: Secondary | ICD-10-CM | POA: Diagnosis not present

## 2019-01-05 DIAGNOSIS — Z955 Presence of coronary angioplasty implant and graft: Secondary | ICD-10-CM | POA: Diagnosis not present

## 2019-01-05 DIAGNOSIS — M79605 Pain in left leg: Secondary | ICD-10-CM | POA: Diagnosis not present

## 2019-01-05 DIAGNOSIS — E669 Obesity, unspecified: Secondary | ICD-10-CM | POA: Diagnosis not present

## 2019-01-05 DIAGNOSIS — Z7982 Long term (current) use of aspirin: Secondary | ICD-10-CM | POA: Diagnosis not present

## 2019-01-05 DIAGNOSIS — R579 Shock, unspecified: Secondary | ICD-10-CM | POA: Diagnosis not present

## 2019-01-05 DIAGNOSIS — Z01810 Encounter for preprocedural cardiovascular examination: Secondary | ICD-10-CM | POA: Diagnosis not present

## 2019-01-05 DIAGNOSIS — Z95828 Presence of other vascular implants and grafts: Secondary | ICD-10-CM | POA: Diagnosis not present

## 2019-01-05 DIAGNOSIS — R400 Somnolence: Secondary | ICD-10-CM | POA: Diagnosis not present

## 2019-01-05 DIAGNOSIS — E785 Hyperlipidemia, unspecified: Secondary | ICD-10-CM | POA: Diagnosis not present

## 2019-01-05 DIAGNOSIS — I2510 Atherosclerotic heart disease of native coronary artery without angina pectoris: Secondary | ICD-10-CM | POA: Diagnosis not present

## 2019-01-05 DIAGNOSIS — I10 Essential (primary) hypertension: Secondary | ICD-10-CM | POA: Diagnosis not present

## 2019-01-05 DIAGNOSIS — E119 Type 2 diabetes mellitus without complications: Secondary | ICD-10-CM | POA: Diagnosis not present

## 2019-01-05 DIAGNOSIS — R52 Pain, unspecified: Secondary | ICD-10-CM | POA: Diagnosis not present

## 2019-01-12 DIAGNOSIS — I10 Essential (primary) hypertension: Secondary | ICD-10-CM | POA: Diagnosis not present

## 2019-01-12 DIAGNOSIS — K219 Gastro-esophageal reflux disease without esophagitis: Secondary | ICD-10-CM | POA: Diagnosis not present

## 2019-01-12 DIAGNOSIS — Z6833 Body mass index (BMI) 33.0-33.9, adult: Secondary | ICD-10-CM | POA: Diagnosis not present

## 2019-01-12 DIAGNOSIS — I2510 Atherosclerotic heart disease of native coronary artery without angina pectoris: Secondary | ICD-10-CM | POA: Diagnosis not present

## 2019-01-12 DIAGNOSIS — E785 Hyperlipidemia, unspecified: Secondary | ICD-10-CM | POA: Diagnosis not present

## 2019-01-12 DIAGNOSIS — E669 Obesity, unspecified: Secondary | ICD-10-CM | POA: Diagnosis not present

## 2019-01-12 DIAGNOSIS — E1151 Type 2 diabetes mellitus with diabetic peripheral angiopathy without gangrene: Secondary | ICD-10-CM | POA: Diagnosis not present

## 2019-01-12 DIAGNOSIS — Z89422 Acquired absence of other left toe(s): Secondary | ICD-10-CM | POA: Diagnosis not present

## 2019-01-12 DIAGNOSIS — Z4781 Encounter for orthopedic aftercare following surgical amputation: Secondary | ICD-10-CM | POA: Diagnosis not present

## 2019-01-14 DIAGNOSIS — Z4781 Encounter for orthopedic aftercare following surgical amputation: Secondary | ICD-10-CM | POA: Diagnosis not present

## 2019-01-14 DIAGNOSIS — K219 Gastro-esophageal reflux disease without esophagitis: Secondary | ICD-10-CM | POA: Diagnosis not present

## 2019-01-14 DIAGNOSIS — E785 Hyperlipidemia, unspecified: Secondary | ICD-10-CM | POA: Diagnosis not present

## 2019-01-14 DIAGNOSIS — I10 Essential (primary) hypertension: Secondary | ICD-10-CM | POA: Diagnosis not present

## 2019-01-14 DIAGNOSIS — E669 Obesity, unspecified: Secondary | ICD-10-CM | POA: Diagnosis not present

## 2019-01-14 DIAGNOSIS — E1151 Type 2 diabetes mellitus with diabetic peripheral angiopathy without gangrene: Secondary | ICD-10-CM | POA: Diagnosis not present

## 2019-01-14 DIAGNOSIS — I2510 Atherosclerotic heart disease of native coronary artery without angina pectoris: Secondary | ICD-10-CM | POA: Diagnosis not present

## 2019-01-14 DIAGNOSIS — Z89422 Acquired absence of other left toe(s): Secondary | ICD-10-CM | POA: Diagnosis not present

## 2019-01-14 DIAGNOSIS — Z6833 Body mass index (BMI) 33.0-33.9, adult: Secondary | ICD-10-CM | POA: Diagnosis not present

## 2019-01-17 DIAGNOSIS — E785 Hyperlipidemia, unspecified: Secondary | ICD-10-CM | POA: Diagnosis not present

## 2019-01-17 DIAGNOSIS — I10 Essential (primary) hypertension: Secondary | ICD-10-CM | POA: Diagnosis not present

## 2019-01-17 DIAGNOSIS — I2510 Atherosclerotic heart disease of native coronary artery without angina pectoris: Secondary | ICD-10-CM | POA: Diagnosis not present

## 2019-01-17 DIAGNOSIS — E669 Obesity, unspecified: Secondary | ICD-10-CM | POA: Diagnosis not present

## 2019-01-17 DIAGNOSIS — Z6833 Body mass index (BMI) 33.0-33.9, adult: Secondary | ICD-10-CM | POA: Diagnosis not present

## 2019-01-17 DIAGNOSIS — Z89422 Acquired absence of other left toe(s): Secondary | ICD-10-CM | POA: Diagnosis not present

## 2019-01-17 DIAGNOSIS — K219 Gastro-esophageal reflux disease without esophagitis: Secondary | ICD-10-CM | POA: Diagnosis not present

## 2019-01-17 DIAGNOSIS — Z4781 Encounter for orthopedic aftercare following surgical amputation: Secondary | ICD-10-CM | POA: Diagnosis not present

## 2019-01-17 DIAGNOSIS — E1151 Type 2 diabetes mellitus with diabetic peripheral angiopathy without gangrene: Secondary | ICD-10-CM | POA: Diagnosis not present

## 2019-01-19 DIAGNOSIS — K219 Gastro-esophageal reflux disease without esophagitis: Secondary | ICD-10-CM | POA: Diagnosis not present

## 2019-01-19 DIAGNOSIS — E1151 Type 2 diabetes mellitus with diabetic peripheral angiopathy without gangrene: Secondary | ICD-10-CM | POA: Diagnosis not present

## 2019-01-19 DIAGNOSIS — E785 Hyperlipidemia, unspecified: Secondary | ICD-10-CM | POA: Diagnosis not present

## 2019-01-19 DIAGNOSIS — E669 Obesity, unspecified: Secondary | ICD-10-CM | POA: Diagnosis not present

## 2019-01-19 DIAGNOSIS — I10 Essential (primary) hypertension: Secondary | ICD-10-CM | POA: Diagnosis not present

## 2019-01-19 DIAGNOSIS — Z6833 Body mass index (BMI) 33.0-33.9, adult: Secondary | ICD-10-CM | POA: Diagnosis not present

## 2019-01-19 DIAGNOSIS — Z4781 Encounter for orthopedic aftercare following surgical amputation: Secondary | ICD-10-CM | POA: Diagnosis not present

## 2019-01-19 DIAGNOSIS — I2510 Atherosclerotic heart disease of native coronary artery without angina pectoris: Secondary | ICD-10-CM | POA: Diagnosis not present

## 2019-01-19 DIAGNOSIS — Z89422 Acquired absence of other left toe(s): Secondary | ICD-10-CM | POA: Diagnosis not present

## 2019-01-24 DIAGNOSIS — E1151 Type 2 diabetes mellitus with diabetic peripheral angiopathy without gangrene: Secondary | ICD-10-CM | POA: Diagnosis not present

## 2019-01-24 DIAGNOSIS — K219 Gastro-esophageal reflux disease without esophagitis: Secondary | ICD-10-CM | POA: Diagnosis not present

## 2019-01-24 DIAGNOSIS — Z4781 Encounter for orthopedic aftercare following surgical amputation: Secondary | ICD-10-CM | POA: Diagnosis not present

## 2019-01-24 DIAGNOSIS — I10 Essential (primary) hypertension: Secondary | ICD-10-CM | POA: Diagnosis not present

## 2019-01-24 DIAGNOSIS — I2510 Atherosclerotic heart disease of native coronary artery without angina pectoris: Secondary | ICD-10-CM | POA: Diagnosis not present

## 2019-01-24 DIAGNOSIS — E785 Hyperlipidemia, unspecified: Secondary | ICD-10-CM | POA: Diagnosis not present

## 2019-01-24 DIAGNOSIS — Z6833 Body mass index (BMI) 33.0-33.9, adult: Secondary | ICD-10-CM | POA: Diagnosis not present

## 2019-01-24 DIAGNOSIS — Z89422 Acquired absence of other left toe(s): Secondary | ICD-10-CM | POA: Diagnosis not present

## 2019-01-24 DIAGNOSIS — E669 Obesity, unspecified: Secondary | ICD-10-CM | POA: Diagnosis not present

## 2019-02-02 DIAGNOSIS — I10 Essential (primary) hypertension: Secondary | ICD-10-CM | POA: Diagnosis not present

## 2019-02-02 DIAGNOSIS — E1151 Type 2 diabetes mellitus with diabetic peripheral angiopathy without gangrene: Secondary | ICD-10-CM | POA: Diagnosis not present

## 2019-02-02 DIAGNOSIS — E669 Obesity, unspecified: Secondary | ICD-10-CM | POA: Diagnosis not present

## 2019-02-02 DIAGNOSIS — E785 Hyperlipidemia, unspecified: Secondary | ICD-10-CM | POA: Diagnosis not present

## 2019-02-02 DIAGNOSIS — I2510 Atherosclerotic heart disease of native coronary artery without angina pectoris: Secondary | ICD-10-CM | POA: Diagnosis not present

## 2019-02-02 DIAGNOSIS — Z6833 Body mass index (BMI) 33.0-33.9, adult: Secondary | ICD-10-CM | POA: Diagnosis not present

## 2019-02-02 DIAGNOSIS — Z89422 Acquired absence of other left toe(s): Secondary | ICD-10-CM | POA: Diagnosis not present

## 2019-02-02 DIAGNOSIS — K219 Gastro-esophageal reflux disease without esophagitis: Secondary | ICD-10-CM | POA: Diagnosis not present

## 2019-02-02 DIAGNOSIS — Z4781 Encounter for orthopedic aftercare following surgical amputation: Secondary | ICD-10-CM | POA: Diagnosis not present

## 2019-02-04 DIAGNOSIS — E669 Obesity, unspecified: Secondary | ICD-10-CM | POA: Diagnosis not present

## 2019-02-04 DIAGNOSIS — Z89422 Acquired absence of other left toe(s): Secondary | ICD-10-CM | POA: Diagnosis not present

## 2019-02-04 DIAGNOSIS — E1151 Type 2 diabetes mellitus with diabetic peripheral angiopathy without gangrene: Secondary | ICD-10-CM | POA: Diagnosis not present

## 2019-02-04 DIAGNOSIS — Z4781 Encounter for orthopedic aftercare following surgical amputation: Secondary | ICD-10-CM | POA: Diagnosis not present

## 2019-02-04 DIAGNOSIS — I10 Essential (primary) hypertension: Secondary | ICD-10-CM | POA: Diagnosis not present

## 2019-02-04 DIAGNOSIS — I2510 Atherosclerotic heart disease of native coronary artery without angina pectoris: Secondary | ICD-10-CM | POA: Diagnosis not present

## 2019-02-04 DIAGNOSIS — E785 Hyperlipidemia, unspecified: Secondary | ICD-10-CM | POA: Diagnosis not present

## 2019-02-04 DIAGNOSIS — K219 Gastro-esophageal reflux disease without esophagitis: Secondary | ICD-10-CM | POA: Diagnosis not present

## 2019-02-04 DIAGNOSIS — Z6833 Body mass index (BMI) 33.0-33.9, adult: Secondary | ICD-10-CM | POA: Diagnosis not present

## 2019-02-08 DIAGNOSIS — I999 Unspecified disorder of circulatory system: Secondary | ICD-10-CM | POA: Diagnosis not present

## 2019-02-08 DIAGNOSIS — I739 Peripheral vascular disease, unspecified: Secondary | ICD-10-CM | POA: Diagnosis not present

## 2019-02-08 DIAGNOSIS — I779 Disorder of arteries and arterioles, unspecified: Secondary | ICD-10-CM | POA: Diagnosis not present

## 2019-02-09 DIAGNOSIS — Z9181 History of falling: Secondary | ICD-10-CM | POA: Diagnosis not present

## 2019-02-09 DIAGNOSIS — E1151 Type 2 diabetes mellitus with diabetic peripheral angiopathy without gangrene: Secondary | ICD-10-CM | POA: Diagnosis not present

## 2019-02-09 DIAGNOSIS — I2510 Atherosclerotic heart disease of native coronary artery without angina pectoris: Secondary | ICD-10-CM | POA: Diagnosis not present

## 2019-02-09 DIAGNOSIS — I739 Peripheral vascular disease, unspecified: Secondary | ICD-10-CM | POA: Diagnosis not present

## 2019-02-09 DIAGNOSIS — K219 Gastro-esophageal reflux disease without esophagitis: Secondary | ICD-10-CM | POA: Diagnosis not present

## 2019-02-09 DIAGNOSIS — Z4781 Encounter for orthopedic aftercare following surgical amputation: Secondary | ICD-10-CM | POA: Diagnosis not present

## 2019-02-09 DIAGNOSIS — Z89431 Acquired absence of right foot: Secondary | ICD-10-CM | POA: Diagnosis not present

## 2019-02-09 DIAGNOSIS — E785 Hyperlipidemia, unspecified: Secondary | ICD-10-CM | POA: Diagnosis not present

## 2019-02-09 DIAGNOSIS — Z89422 Acquired absence of other left toe(s): Secondary | ICD-10-CM | POA: Diagnosis not present

## 2019-02-09 DIAGNOSIS — Z959 Presence of cardiac and vascular implant and graft, unspecified: Secondary | ICD-10-CM | POA: Diagnosis not present

## 2019-02-09 DIAGNOSIS — Z6833 Body mass index (BMI) 33.0-33.9, adult: Secondary | ICD-10-CM | POA: Diagnosis not present

## 2019-02-09 DIAGNOSIS — I10 Essential (primary) hypertension: Secondary | ICD-10-CM | POA: Diagnosis not present

## 2019-02-09 DIAGNOSIS — E669 Obesity, unspecified: Secondary | ICD-10-CM | POA: Diagnosis not present

## 2019-02-09 DIAGNOSIS — I998 Other disorder of circulatory system: Secondary | ICD-10-CM | POA: Diagnosis not present

## 2019-02-17 DIAGNOSIS — I10 Essential (primary) hypertension: Secondary | ICD-10-CM | POA: Diagnosis not present

## 2019-02-17 DIAGNOSIS — I2510 Atherosclerotic heart disease of native coronary artery without angina pectoris: Secondary | ICD-10-CM | POA: Diagnosis not present

## 2019-02-17 DIAGNOSIS — Z4781 Encounter for orthopedic aftercare following surgical amputation: Secondary | ICD-10-CM | POA: Diagnosis not present

## 2019-02-17 DIAGNOSIS — E1151 Type 2 diabetes mellitus with diabetic peripheral angiopathy without gangrene: Secondary | ICD-10-CM | POA: Diagnosis not present

## 2019-02-17 DIAGNOSIS — Z6833 Body mass index (BMI) 33.0-33.9, adult: Secondary | ICD-10-CM | POA: Diagnosis not present

## 2019-02-17 DIAGNOSIS — K219 Gastro-esophageal reflux disease without esophagitis: Secondary | ICD-10-CM | POA: Diagnosis not present

## 2019-02-17 DIAGNOSIS — Z89422 Acquired absence of other left toe(s): Secondary | ICD-10-CM | POA: Diagnosis not present

## 2019-02-17 DIAGNOSIS — E669 Obesity, unspecified: Secondary | ICD-10-CM | POA: Diagnosis not present

## 2019-02-17 DIAGNOSIS — E785 Hyperlipidemia, unspecified: Secondary | ICD-10-CM | POA: Diagnosis not present

## 2019-02-22 DIAGNOSIS — Z89432 Acquired absence of left foot: Secondary | ICD-10-CM | POA: Diagnosis not present

## 2019-02-22 DIAGNOSIS — Z959 Presence of cardiac and vascular implant and graft, unspecified: Secondary | ICD-10-CM | POA: Diagnosis not present

## 2019-02-22 DIAGNOSIS — I998 Other disorder of circulatory system: Secondary | ICD-10-CM | POA: Diagnosis not present

## 2019-02-23 DIAGNOSIS — K219 Gastro-esophageal reflux disease without esophagitis: Secondary | ICD-10-CM | POA: Diagnosis not present

## 2019-02-23 DIAGNOSIS — Z4781 Encounter for orthopedic aftercare following surgical amputation: Secondary | ICD-10-CM | POA: Diagnosis not present

## 2019-02-23 DIAGNOSIS — E785 Hyperlipidemia, unspecified: Secondary | ICD-10-CM | POA: Diagnosis not present

## 2019-02-23 DIAGNOSIS — I10 Essential (primary) hypertension: Secondary | ICD-10-CM | POA: Diagnosis not present

## 2019-02-23 DIAGNOSIS — E669 Obesity, unspecified: Secondary | ICD-10-CM | POA: Diagnosis not present

## 2019-02-23 DIAGNOSIS — I2510 Atherosclerotic heart disease of native coronary artery without angina pectoris: Secondary | ICD-10-CM | POA: Diagnosis not present

## 2019-02-23 DIAGNOSIS — E1151 Type 2 diabetes mellitus with diabetic peripheral angiopathy without gangrene: Secondary | ICD-10-CM | POA: Diagnosis not present

## 2019-02-23 DIAGNOSIS — Z6833 Body mass index (BMI) 33.0-33.9, adult: Secondary | ICD-10-CM | POA: Diagnosis not present

## 2019-02-23 DIAGNOSIS — Z89422 Acquired absence of other left toe(s): Secondary | ICD-10-CM | POA: Diagnosis not present

## 2019-02-28 DIAGNOSIS — Z89422 Acquired absence of other left toe(s): Secondary | ICD-10-CM | POA: Diagnosis not present

## 2019-02-28 DIAGNOSIS — E785 Hyperlipidemia, unspecified: Secondary | ICD-10-CM | POA: Diagnosis not present

## 2019-02-28 DIAGNOSIS — E669 Obesity, unspecified: Secondary | ICD-10-CM | POA: Diagnosis not present

## 2019-02-28 DIAGNOSIS — I2510 Atherosclerotic heart disease of native coronary artery without angina pectoris: Secondary | ICD-10-CM | POA: Diagnosis not present

## 2019-02-28 DIAGNOSIS — Z4781 Encounter for orthopedic aftercare following surgical amputation: Secondary | ICD-10-CM | POA: Diagnosis not present

## 2019-02-28 DIAGNOSIS — I10 Essential (primary) hypertension: Secondary | ICD-10-CM | POA: Diagnosis not present

## 2019-02-28 DIAGNOSIS — K219 Gastro-esophageal reflux disease without esophagitis: Secondary | ICD-10-CM | POA: Diagnosis not present

## 2019-02-28 DIAGNOSIS — E1151 Type 2 diabetes mellitus with diabetic peripheral angiopathy without gangrene: Secondary | ICD-10-CM | POA: Diagnosis not present

## 2019-02-28 DIAGNOSIS — Z6833 Body mass index (BMI) 33.0-33.9, adult: Secondary | ICD-10-CM | POA: Diagnosis not present

## 2019-03-10 DIAGNOSIS — E1151 Type 2 diabetes mellitus with diabetic peripheral angiopathy without gangrene: Secondary | ICD-10-CM | POA: Diagnosis not present

## 2019-03-10 DIAGNOSIS — E785 Hyperlipidemia, unspecified: Secondary | ICD-10-CM | POA: Diagnosis not present

## 2019-03-10 DIAGNOSIS — Z4781 Encounter for orthopedic aftercare following surgical amputation: Secondary | ICD-10-CM | POA: Diagnosis not present

## 2019-03-10 DIAGNOSIS — Z6833 Body mass index (BMI) 33.0-33.9, adult: Secondary | ICD-10-CM | POA: Diagnosis not present

## 2019-03-10 DIAGNOSIS — I10 Essential (primary) hypertension: Secondary | ICD-10-CM | POA: Diagnosis not present

## 2019-03-10 DIAGNOSIS — I2510 Atherosclerotic heart disease of native coronary artery without angina pectoris: Secondary | ICD-10-CM | POA: Diagnosis not present

## 2019-03-10 DIAGNOSIS — K219 Gastro-esophageal reflux disease without esophagitis: Secondary | ICD-10-CM | POA: Diagnosis not present

## 2019-03-10 DIAGNOSIS — Z89422 Acquired absence of other left toe(s): Secondary | ICD-10-CM | POA: Diagnosis not present

## 2019-03-10 DIAGNOSIS — E669 Obesity, unspecified: Secondary | ICD-10-CM | POA: Diagnosis not present

## 2019-03-25 DIAGNOSIS — E1151 Type 2 diabetes mellitus with diabetic peripheral angiopathy without gangrene: Secondary | ICD-10-CM | POA: Diagnosis not present

## 2019-03-25 DIAGNOSIS — I739 Peripheral vascular disease, unspecified: Secondary | ICD-10-CM | POA: Diagnosis not present

## 2019-03-25 DIAGNOSIS — Z89432 Acquired absence of left foot: Secondary | ICD-10-CM | POA: Diagnosis not present

## 2019-04-19 DIAGNOSIS — E1151 Type 2 diabetes mellitus with diabetic peripheral angiopathy without gangrene: Secondary | ICD-10-CM | POA: Diagnosis not present

## 2019-04-19 DIAGNOSIS — Z89432 Acquired absence of left foot: Secondary | ICD-10-CM | POA: Diagnosis not present

## 2019-05-03 DIAGNOSIS — S98312A Complete traumatic amputation of left midfoot, initial encounter: Secondary | ICD-10-CM | POA: Diagnosis not present

## 2019-05-03 DIAGNOSIS — E1151 Type 2 diabetes mellitus with diabetic peripheral angiopathy without gangrene: Secondary | ICD-10-CM | POA: Diagnosis not present

## 2019-05-13 DIAGNOSIS — I2510 Atherosclerotic heart disease of native coronary artery without angina pectoris: Secondary | ICD-10-CM | POA: Diagnosis not present

## 2019-05-13 DIAGNOSIS — T82858A Stenosis of vascular prosthetic devices, implants and grafts, initial encounter: Secondary | ICD-10-CM | POA: Diagnosis not present

## 2019-05-13 DIAGNOSIS — I739 Peripheral vascular disease, unspecified: Secondary | ICD-10-CM | POA: Diagnosis not present

## 2019-05-13 DIAGNOSIS — I771 Stricture of artery: Secondary | ICD-10-CM | POA: Diagnosis not present

## 2019-05-13 DIAGNOSIS — Z89432 Acquired absence of left foot: Secondary | ICD-10-CM | POA: Diagnosis not present

## 2019-05-13 DIAGNOSIS — I10 Essential (primary) hypertension: Secondary | ICD-10-CM | POA: Diagnosis not present

## 2019-05-13 DIAGNOSIS — I999 Unspecified disorder of circulatory system: Secondary | ICD-10-CM | POA: Diagnosis not present

## 2019-06-08 DIAGNOSIS — R197 Diarrhea, unspecified: Secondary | ICD-10-CM | POA: Diagnosis not present

## 2019-06-08 DIAGNOSIS — R42 Dizziness and giddiness: Secondary | ICD-10-CM | POA: Diagnosis not present

## 2019-06-08 DIAGNOSIS — R112 Nausea with vomiting, unspecified: Secondary | ICD-10-CM | POA: Diagnosis not present

## 2019-06-08 DIAGNOSIS — R1110 Vomiting, unspecified: Secondary | ICD-10-CM | POA: Diagnosis not present

## 2019-06-08 DIAGNOSIS — R110 Nausea: Secondary | ICD-10-CM | POA: Diagnosis not present

## 2019-06-08 DIAGNOSIS — R1111 Vomiting without nausea: Secondary | ICD-10-CM | POA: Diagnosis not present

## 2019-06-14 DIAGNOSIS — K859 Acute pancreatitis without necrosis or infection, unspecified: Secondary | ICD-10-CM | POA: Diagnosis not present

## 2019-07-06 DIAGNOSIS — L247 Irritant contact dermatitis due to plants, except food: Secondary | ICD-10-CM | POA: Diagnosis not present

## 2019-07-11 DIAGNOSIS — E782 Mixed hyperlipidemia: Secondary | ICD-10-CM | POA: Diagnosis not present

## 2019-07-11 DIAGNOSIS — Z794 Long term (current) use of insulin: Secondary | ICD-10-CM | POA: Diagnosis not present

## 2019-07-11 DIAGNOSIS — I10 Essential (primary) hypertension: Secondary | ICD-10-CM | POA: Diagnosis not present

## 2019-07-11 DIAGNOSIS — E1151 Type 2 diabetes mellitus with diabetic peripheral angiopathy without gangrene: Secondary | ICD-10-CM | POA: Diagnosis not present

## 2019-07-13 DIAGNOSIS — Z89432 Acquired absence of left foot: Secondary | ICD-10-CM | POA: Diagnosis not present

## 2019-07-13 DIAGNOSIS — E782 Mixed hyperlipidemia: Secondary | ICD-10-CM | POA: Diagnosis not present

## 2019-07-13 DIAGNOSIS — I739 Peripheral vascular disease, unspecified: Secondary | ICD-10-CM | POA: Diagnosis not present

## 2019-07-13 DIAGNOSIS — Z0000 Encounter for general adult medical examination without abnormal findings: Secondary | ICD-10-CM | POA: Diagnosis not present

## 2019-07-13 DIAGNOSIS — I2510 Atherosclerotic heart disease of native coronary artery without angina pectoris: Secondary | ICD-10-CM | POA: Diagnosis not present

## 2019-07-13 DIAGNOSIS — Z794 Long term (current) use of insulin: Secondary | ICD-10-CM | POA: Diagnosis not present

## 2019-07-13 DIAGNOSIS — E1151 Type 2 diabetes mellitus with diabetic peripheral angiopathy without gangrene: Secondary | ICD-10-CM | POA: Diagnosis not present

## 2019-07-13 DIAGNOSIS — I10 Essential (primary) hypertension: Secondary | ICD-10-CM | POA: Diagnosis not present

## 2019-07-22 DIAGNOSIS — R55 Syncope and collapse: Secondary | ICD-10-CM

## 2019-07-22 DIAGNOSIS — K529 Noninfective gastroenteritis and colitis, unspecified: Secondary | ICD-10-CM | POA: Diagnosis not present

## 2019-07-22 DIAGNOSIS — N179 Acute kidney failure, unspecified: Secondary | ICD-10-CM | POA: Diagnosis not present

## 2019-07-22 DIAGNOSIS — E875 Hyperkalemia: Secondary | ICD-10-CM | POA: Diagnosis not present

## 2019-07-22 DIAGNOSIS — E1169 Type 2 diabetes mellitus with other specified complication: Secondary | ICD-10-CM | POA: Diagnosis not present

## 2019-07-22 DIAGNOSIS — K209 Esophagitis, unspecified without bleeding: Secondary | ICD-10-CM | POA: Diagnosis not present

## 2019-07-22 DIAGNOSIS — R4182 Altered mental status, unspecified: Secondary | ICD-10-CM | POA: Diagnosis not present

## 2019-07-22 DIAGNOSIS — R1111 Vomiting without nausea: Secondary | ICD-10-CM | POA: Diagnosis not present

## 2019-07-22 DIAGNOSIS — R112 Nausea with vomiting, unspecified: Secondary | ICD-10-CM | POA: Diagnosis not present

## 2019-07-22 DIAGNOSIS — R110 Nausea: Secondary | ICD-10-CM | POA: Diagnosis not present

## 2019-07-22 DIAGNOSIS — R197 Diarrhea, unspecified: Secondary | ICD-10-CM | POA: Diagnosis not present

## 2019-07-22 DIAGNOSIS — R1084 Generalized abdominal pain: Secondary | ICD-10-CM | POA: Diagnosis not present

## 2019-07-22 DIAGNOSIS — E860 Dehydration: Secondary | ICD-10-CM | POA: Diagnosis not present

## 2019-07-22 DIAGNOSIS — E872 Acidosis: Secondary | ICD-10-CM | POA: Diagnosis not present

## 2019-07-22 DIAGNOSIS — I2510 Atherosclerotic heart disease of native coronary artery without angina pectoris: Secondary | ICD-10-CM | POA: Diagnosis not present

## 2019-07-22 DIAGNOSIS — I10 Essential (primary) hypertension: Secondary | ICD-10-CM | POA: Diagnosis not present

## 2019-07-22 DIAGNOSIS — R109 Unspecified abdominal pain: Secondary | ICD-10-CM | POA: Diagnosis not present

## 2019-07-23 DIAGNOSIS — R55 Syncope and collapse: Secondary | ICD-10-CM | POA: Diagnosis not present

## 2019-07-23 DIAGNOSIS — R112 Nausea with vomiting, unspecified: Secondary | ICD-10-CM | POA: Diagnosis not present

## 2019-07-23 DIAGNOSIS — E872 Acidosis: Secondary | ICD-10-CM | POA: Diagnosis not present

## 2019-07-23 DIAGNOSIS — E875 Hyperkalemia: Secondary | ICD-10-CM | POA: Diagnosis not present

## 2019-07-23 DIAGNOSIS — E1169 Type 2 diabetes mellitus with other specified complication: Secondary | ICD-10-CM | POA: Diagnosis not present

## 2019-07-23 DIAGNOSIS — N179 Acute kidney failure, unspecified: Secondary | ICD-10-CM | POA: Diagnosis not present

## 2019-07-24 DIAGNOSIS — R55 Syncope and collapse: Secondary | ICD-10-CM | POA: Diagnosis not present

## 2019-07-24 DIAGNOSIS — E875 Hyperkalemia: Secondary | ICD-10-CM | POA: Diagnosis not present

## 2019-07-24 DIAGNOSIS — R112 Nausea with vomiting, unspecified: Secondary | ICD-10-CM | POA: Diagnosis not present

## 2019-07-24 DIAGNOSIS — N179 Acute kidney failure, unspecified: Secondary | ICD-10-CM | POA: Diagnosis not present

## 2019-07-24 DIAGNOSIS — E872 Acidosis: Secondary | ICD-10-CM | POA: Diagnosis not present

## 2019-07-24 DIAGNOSIS — E1169 Type 2 diabetes mellitus with other specified complication: Secondary | ICD-10-CM | POA: Diagnosis not present

## 2019-07-27 DIAGNOSIS — I2510 Atherosclerotic heart disease of native coronary artery without angina pectoris: Secondary | ICD-10-CM | POA: Diagnosis not present

## 2019-07-27 DIAGNOSIS — K529 Noninfective gastroenteritis and colitis, unspecified: Secondary | ICD-10-CM | POA: Diagnosis not present

## 2019-07-27 DIAGNOSIS — E1151 Type 2 diabetes mellitus with diabetic peripheral angiopathy without gangrene: Secondary | ICD-10-CM | POA: Diagnosis not present

## 2019-07-27 DIAGNOSIS — N179 Acute kidney failure, unspecified: Secondary | ICD-10-CM | POA: Diagnosis not present

## 2019-07-27 DIAGNOSIS — E782 Mixed hyperlipidemia: Secondary | ICD-10-CM | POA: Diagnosis not present

## 2019-07-27 DIAGNOSIS — K2100 Gastro-esophageal reflux disease with esophagitis, without bleeding: Secondary | ICD-10-CM | POA: Diagnosis not present

## 2019-07-27 DIAGNOSIS — Z794 Long term (current) use of insulin: Secondary | ICD-10-CM | POA: Diagnosis not present

## 2019-07-27 DIAGNOSIS — I10 Essential (primary) hypertension: Secondary | ICD-10-CM | POA: Diagnosis not present

## 2019-08-09 DIAGNOSIS — E1151 Type 2 diabetes mellitus with diabetic peripheral angiopathy without gangrene: Secondary | ICD-10-CM | POA: Diagnosis not present

## 2019-08-09 DIAGNOSIS — I10 Essential (primary) hypertension: Secondary | ICD-10-CM | POA: Diagnosis not present

## 2019-08-09 DIAGNOSIS — R9431 Abnormal electrocardiogram [ECG] [EKG]: Secondary | ICD-10-CM | POA: Diagnosis not present

## 2019-08-09 DIAGNOSIS — Z794 Long term (current) use of insulin: Secondary | ICD-10-CM | POA: Diagnosis not present

## 2019-08-09 DIAGNOSIS — E782 Mixed hyperlipidemia: Secondary | ICD-10-CM | POA: Diagnosis not present

## 2019-08-09 DIAGNOSIS — I2510 Atherosclerotic heart disease of native coronary artery without angina pectoris: Secondary | ICD-10-CM | POA: Diagnosis not present

## 2019-08-09 DIAGNOSIS — I999 Unspecified disorder of circulatory system: Secondary | ICD-10-CM | POA: Diagnosis not present

## 2019-08-09 DIAGNOSIS — Z7689 Persons encountering health services in other specified circumstances: Secondary | ICD-10-CM | POA: Diagnosis not present

## 2019-08-23 DIAGNOSIS — Z794 Long term (current) use of insulin: Secondary | ICD-10-CM | POA: Diagnosis not present

## 2019-08-23 DIAGNOSIS — I739 Peripheral vascular disease, unspecified: Secondary | ICD-10-CM | POA: Diagnosis not present

## 2019-08-23 DIAGNOSIS — E1151 Type 2 diabetes mellitus with diabetic peripheral angiopathy without gangrene: Secondary | ICD-10-CM | POA: Diagnosis not present

## 2019-08-23 DIAGNOSIS — Z89432 Acquired absence of left foot: Secondary | ICD-10-CM | POA: Diagnosis not present

## 2019-08-24 DIAGNOSIS — I2510 Atherosclerotic heart disease of native coronary artery without angina pectoris: Secondary | ICD-10-CM | POA: Diagnosis not present

## 2019-08-24 DIAGNOSIS — I10 Essential (primary) hypertension: Secondary | ICD-10-CM | POA: Diagnosis not present

## 2019-09-06 DIAGNOSIS — D649 Anemia, unspecified: Secondary | ICD-10-CM | POA: Diagnosis not present

## 2019-09-27 DIAGNOSIS — L304 Erythema intertrigo: Secondary | ICD-10-CM | POA: Diagnosis not present

## 2019-10-14 DIAGNOSIS — Z20822 Contact with and (suspected) exposure to covid-19: Secondary | ICD-10-CM | POA: Diagnosis not present

## 2019-11-04 DIAGNOSIS — Z1211 Encounter for screening for malignant neoplasm of colon: Secondary | ICD-10-CM | POA: Diagnosis not present

## 2019-11-04 DIAGNOSIS — R112 Nausea with vomiting, unspecified: Secondary | ICD-10-CM | POA: Diagnosis not present

## 2019-11-04 DIAGNOSIS — K2100 Gastro-esophageal reflux disease with esophagitis, without bleeding: Secondary | ICD-10-CM | POA: Diagnosis not present

## 2019-11-04 DIAGNOSIS — Z8371 Family history of colonic polyps: Secondary | ICD-10-CM | POA: Diagnosis not present

## 2019-11-18 DIAGNOSIS — Z794 Long term (current) use of insulin: Secondary | ICD-10-CM | POA: Diagnosis not present

## 2019-11-18 DIAGNOSIS — Z89432 Acquired absence of left foot: Secondary | ICD-10-CM | POA: Diagnosis not present

## 2019-11-18 DIAGNOSIS — Z95820 Peripheral vascular angioplasty status with implants and grafts: Secondary | ICD-10-CM | POA: Diagnosis not present

## 2019-11-18 DIAGNOSIS — I999 Unspecified disorder of circulatory system: Secondary | ICD-10-CM | POA: Diagnosis not present

## 2019-11-18 DIAGNOSIS — I739 Peripheral vascular disease, unspecified: Secondary | ICD-10-CM | POA: Diagnosis not present

## 2019-11-18 DIAGNOSIS — I10 Essential (primary) hypertension: Secondary | ICD-10-CM | POA: Diagnosis not present

## 2019-11-18 DIAGNOSIS — T82856A Stenosis of peripheral vascular stent, initial encounter: Secondary | ICD-10-CM | POA: Diagnosis not present

## 2019-11-18 DIAGNOSIS — E1151 Type 2 diabetes mellitus with diabetic peripheral angiopathy without gangrene: Secondary | ICD-10-CM | POA: Diagnosis not present

## 2020-01-03 DIAGNOSIS — U071 COVID-19: Secondary | ICD-10-CM | POA: Diagnosis not present

## 2020-01-03 DIAGNOSIS — R0789 Other chest pain: Secondary | ICD-10-CM | POA: Diagnosis not present

## 2020-01-03 DIAGNOSIS — J189 Pneumonia, unspecified organism: Secondary | ICD-10-CM | POA: Diagnosis not present

## 2020-01-03 DIAGNOSIS — R070 Pain in throat: Secondary | ICD-10-CM | POA: Diagnosis not present

## 2020-01-03 DIAGNOSIS — R0602 Shortness of breath: Secondary | ICD-10-CM | POA: Diagnosis not present

## 2020-01-03 DIAGNOSIS — R0902 Hypoxemia: Secondary | ICD-10-CM | POA: Diagnosis not present

## 2020-01-03 DIAGNOSIS — R051 Acute cough: Secondary | ICD-10-CM | POA: Diagnosis not present

## 2020-01-03 DIAGNOSIS — R4020 Unspecified coma: Secondary | ICD-10-CM | POA: Diagnosis not present

## 2020-01-03 DIAGNOSIS — Z20828 Contact with and (suspected) exposure to other viral communicable diseases: Secondary | ICD-10-CM | POA: Diagnosis not present

## 2020-01-03 DIAGNOSIS — R0981 Nasal congestion: Secondary | ICD-10-CM | POA: Diagnosis not present

## 2020-01-03 DIAGNOSIS — R112 Nausea with vomiting, unspecified: Secondary | ICD-10-CM | POA: Diagnosis not present

## 2020-01-04 DIAGNOSIS — R059 Cough, unspecified: Secondary | ICD-10-CM | POA: Diagnosis not present

## 2020-01-04 DIAGNOSIS — R5383 Other fatigue: Secondary | ICD-10-CM | POA: Diagnosis not present

## 2020-01-04 DIAGNOSIS — U071 COVID-19: Secondary | ICD-10-CM | POA: Diagnosis not present

## 2020-01-04 DIAGNOSIS — J1282 Pneumonia due to coronavirus disease 2019: Secondary | ICD-10-CM | POA: Diagnosis not present

## 2020-01-06 DIAGNOSIS — R531 Weakness: Secondary | ICD-10-CM | POA: Diagnosis not present

## 2020-01-06 DIAGNOSIS — J181 Lobar pneumonia, unspecified organism: Secondary | ICD-10-CM | POA: Diagnosis not present

## 2020-01-06 DIAGNOSIS — E860 Dehydration: Secondary | ICD-10-CM | POA: Diagnosis not present

## 2020-01-06 DIAGNOSIS — J189 Pneumonia, unspecified organism: Secondary | ICD-10-CM | POA: Diagnosis not present

## 2020-01-06 DIAGNOSIS — J9811 Atelectasis: Secondary | ICD-10-CM | POA: Diagnosis not present

## 2020-01-06 DIAGNOSIS — R0602 Shortness of breath: Secondary | ICD-10-CM | POA: Diagnosis not present

## 2020-01-06 DIAGNOSIS — E161 Other hypoglycemia: Secondary | ICD-10-CM | POA: Diagnosis not present

## 2020-01-06 DIAGNOSIS — M1990 Unspecified osteoarthritis, unspecified site: Secondary | ICD-10-CM | POA: Diagnosis not present

## 2020-01-06 DIAGNOSIS — Z7982 Long term (current) use of aspirin: Secondary | ICD-10-CM | POA: Diagnosis not present

## 2020-01-06 DIAGNOSIS — D703 Neutropenia due to infection: Secondary | ICD-10-CM | POA: Diagnosis not present

## 2020-01-06 DIAGNOSIS — U071 COVID-19: Secondary | ICD-10-CM | POA: Diagnosis not present

## 2020-01-06 DIAGNOSIS — I2510 Atherosclerotic heart disease of native coronary artery without angina pectoris: Secondary | ICD-10-CM | POA: Diagnosis not present

## 2020-01-06 DIAGNOSIS — Z7984 Long term (current) use of oral hypoglycemic drugs: Secondary | ICD-10-CM | POA: Diagnosis not present

## 2020-01-06 DIAGNOSIS — E119 Type 2 diabetes mellitus without complications: Secondary | ICD-10-CM | POA: Diagnosis not present

## 2020-01-06 DIAGNOSIS — E162 Hypoglycemia, unspecified: Secondary | ICD-10-CM | POA: Diagnosis not present

## 2020-01-06 DIAGNOSIS — I951 Orthostatic hypotension: Secondary | ICD-10-CM | POA: Diagnosis not present

## 2020-01-06 DIAGNOSIS — N179 Acute kidney failure, unspecified: Secondary | ICD-10-CM | POA: Diagnosis not present

## 2020-01-07 DIAGNOSIS — U071 COVID-19: Secondary | ICD-10-CM | POA: Diagnosis not present

## 2020-01-07 DIAGNOSIS — N179 Acute kidney failure, unspecified: Secondary | ICD-10-CM | POA: Diagnosis not present

## 2020-01-07 DIAGNOSIS — J189 Pneumonia, unspecified organism: Secondary | ICD-10-CM | POA: Diagnosis not present

## 2020-01-08 DIAGNOSIS — U071 COVID-19: Secondary | ICD-10-CM | POA: Diagnosis not present

## 2020-01-08 DIAGNOSIS — J189 Pneumonia, unspecified organism: Secondary | ICD-10-CM | POA: Diagnosis not present

## 2020-01-08 DIAGNOSIS — N179 Acute kidney failure, unspecified: Secondary | ICD-10-CM | POA: Diagnosis not present

## 2020-02-07 DIAGNOSIS — E1151 Type 2 diabetes mellitus with diabetic peripheral angiopathy without gangrene: Secondary | ICD-10-CM | POA: Diagnosis not present

## 2020-02-07 DIAGNOSIS — I2510 Atherosclerotic heart disease of native coronary artery without angina pectoris: Secondary | ICD-10-CM | POA: Diagnosis not present

## 2020-02-07 DIAGNOSIS — Z794 Long term (current) use of insulin: Secondary | ICD-10-CM | POA: Diagnosis not present

## 2020-02-07 DIAGNOSIS — I739 Peripheral vascular disease, unspecified: Secondary | ICD-10-CM | POA: Diagnosis not present

## 2020-02-07 DIAGNOSIS — Z89432 Acquired absence of left foot: Secondary | ICD-10-CM | POA: Diagnosis not present

## 2020-02-07 DIAGNOSIS — I10 Essential (primary) hypertension: Secondary | ICD-10-CM | POA: Diagnosis not present

## 2020-09-04 DIAGNOSIS — R079 Chest pain, unspecified: Secondary | ICD-10-CM | POA: Diagnosis not present
